# Patient Record
Sex: Female | Born: 1955 | Race: White | Hispanic: No | Smoking: Current every day smoker
Health system: Southern US, Community
[De-identification: ages and names within clinical notes are randomized; demographics above are authoritative.]

## PROBLEM LIST (undated history)

## (undated) DIAGNOSIS — J984 Other disorders of lung: Secondary | ICD-10-CM

## (undated) DIAGNOSIS — F419 Anxiety disorder, unspecified: Secondary | ICD-10-CM

## (undated) DIAGNOSIS — J189 Pneumonia, unspecified organism: Secondary | ICD-10-CM

## (undated) DIAGNOSIS — J449 Chronic obstructive pulmonary disease, unspecified: Secondary | ICD-10-CM

## (undated) DIAGNOSIS — F329 Major depressive disorder, single episode, unspecified: Secondary | ICD-10-CM

## (undated) DIAGNOSIS — I739 Peripheral vascular disease, unspecified: Secondary | ICD-10-CM

## (undated) DIAGNOSIS — F32A Depression, unspecified: Secondary | ICD-10-CM

## (undated) DIAGNOSIS — J410 Simple chronic bronchitis: Secondary | ICD-10-CM

## (undated) DIAGNOSIS — I639 Cerebral infarction, unspecified: Secondary | ICD-10-CM

## (undated) DIAGNOSIS — E785 Hyperlipidemia, unspecified: Secondary | ICD-10-CM

## (undated) HISTORY — PX: COLONOSCOPY: SHX174

## (undated) HISTORY — PX: TUBAL LIGATION: SHX77

## (undated) HISTORY — PX: AORTO-FEMORAL BYPASS GRAFT: SHX885

---

## 1998-09-16 ENCOUNTER — Other Ambulatory Visit: Admission: RE | Admit: 1998-09-16 | Discharge: 1998-09-16 | Payer: Self-pay | Admitting: *Deleted

## 1999-08-25 ENCOUNTER — Encounter: Payer: Self-pay | Admitting: Emergency Medicine

## 1999-08-25 ENCOUNTER — Inpatient Hospital Stay (HOSPITAL_COMMUNITY): Admission: EM | Admit: 1999-08-25 | Discharge: 1999-08-26 | Payer: Self-pay | Admitting: Emergency Medicine

## 1999-08-25 ENCOUNTER — Encounter: Payer: Self-pay | Admitting: Family Medicine

## 1999-08-26 ENCOUNTER — Encounter: Payer: Self-pay | Admitting: Family Medicine

## 1999-09-03 ENCOUNTER — Encounter: Admission: RE | Admit: 1999-09-03 | Discharge: 1999-09-03 | Payer: Self-pay | Admitting: Family Medicine

## 1999-11-05 ENCOUNTER — Other Ambulatory Visit: Admission: RE | Admit: 1999-11-05 | Discharge: 1999-11-05 | Payer: Self-pay | Admitting: *Deleted

## 2015-12-11 DIAGNOSIS — I639 Cerebral infarction, unspecified: Secondary | ICD-10-CM | POA: Diagnosis present

## 2015-12-11 HISTORY — DX: Cerebral infarction, unspecified: I63.9

## 2015-12-24 DIAGNOSIS — J189 Pneumonia, unspecified organism: Secondary | ICD-10-CM

## 2015-12-24 HISTORY — DX: Pneumonia, unspecified organism: J18.9

## 2015-12-27 ENCOUNTER — Other Ambulatory Visit (HOSPITAL_COMMUNITY): Payer: Self-pay | Admitting: Interventional Radiology

## 2015-12-27 DIAGNOSIS — I6350 Cerebral infarction due to unspecified occlusion or stenosis of unspecified cerebral artery: Secondary | ICD-10-CM

## 2015-12-27 DIAGNOSIS — Q2730 Arteriovenous malformation, site unspecified: Secondary | ICD-10-CM

## 2016-01-13 ENCOUNTER — Other Ambulatory Visit (HOSPITAL_COMMUNITY): Payer: Self-pay | Admitting: Interventional Radiology

## 2016-01-13 ENCOUNTER — Ambulatory Visit (HOSPITAL_COMMUNITY)
Admission: RE | Admit: 2016-01-13 | Discharge: 2016-01-13 | Disposition: A | Discharge status: Home / Self Care | Payer: BLUE CROSS/BLUE SHIELD | Source: Ambulatory Visit | Attending: Interventional Radiology | Admitting: Interventional Radiology

## 2016-01-13 DIAGNOSIS — I6350 Cerebral infarction due to unspecified occlusion or stenosis of unspecified cerebral artery: Secondary | ICD-10-CM

## 2016-01-13 DIAGNOSIS — I671 Cerebral aneurysm, nonruptured: Secondary | ICD-10-CM

## 2016-01-13 DIAGNOSIS — Q2730 Arteriovenous malformation, site unspecified: Secondary | ICD-10-CM

## 2016-01-13 DIAGNOSIS — I6320 Cerebral infarction due to unspecified occlusion or stenosis of unspecified precerebral arteries: Secondary | ICD-10-CM

## 2016-01-16 ENCOUNTER — Other Ambulatory Visit: Payer: Self-pay | Admitting: Radiology

## 2016-01-17 ENCOUNTER — Other Ambulatory Visit (HOSPITAL_COMMUNITY): Payer: Self-pay | Admitting: Interventional Radiology

## 2016-01-17 ENCOUNTER — Ambulatory Visit (HOSPITAL_COMMUNITY)
Admission: RE | Admit: 2016-01-17 | Discharge: 2016-01-17 | Disposition: A | Discharge status: Home / Self Care | Payer: BLUE CROSS/BLUE SHIELD | Source: Ambulatory Visit | Attending: Interventional Radiology | Admitting: Interventional Radiology

## 2016-01-17 DIAGNOSIS — I6320 Cerebral infarction due to unspecified occlusion or stenosis of unspecified precerebral arteries: Secondary | ICD-10-CM

## 2016-01-17 DIAGNOSIS — Z7902 Long term (current) use of antithrombotics/antiplatelets: Secondary | ICD-10-CM | POA: Insufficient documentation

## 2016-01-17 DIAGNOSIS — J439 Emphysema, unspecified: Secondary | ICD-10-CM | POA: Insufficient documentation

## 2016-01-17 DIAGNOSIS — Z880 Allergy status to penicillin: Secondary | ICD-10-CM | POA: Insufficient documentation

## 2016-01-17 DIAGNOSIS — F17210 Nicotine dependence, cigarettes, uncomplicated: Secondary | ICD-10-CM | POA: Insufficient documentation

## 2016-01-17 DIAGNOSIS — Z7982 Long term (current) use of aspirin: Secondary | ICD-10-CM | POA: Insufficient documentation

## 2016-01-17 DIAGNOSIS — I671 Cerebral aneurysm, nonruptured: Secondary | ICD-10-CM

## 2016-01-17 DIAGNOSIS — Z8673 Personal history of transient ischemic attack (TIA), and cerebral infarction without residual deficits: Secondary | ICD-10-CM | POA: Insufficient documentation

## 2016-01-17 DIAGNOSIS — G450 Vertebro-basilar artery syndrome: Secondary | ICD-10-CM | POA: Insufficient documentation

## 2016-01-17 LAB — CBC WITH DIFFERENTIAL/PLATELET
Basophils Absolute: 0.1 10*3/uL (ref 0.0–0.1)
Basophils Relative: 0 %
EOS ABS: 1.3 10*3/uL — AB (ref 0.0–0.7)
EOS PCT: 11 %
HCT: 47.5 % — ABNORMAL HIGH (ref 36.0–46.0)
Hemoglobin: 16.2 g/dL — ABNORMAL HIGH (ref 12.0–15.0)
LYMPHS ABS: 1.4 10*3/uL (ref 0.7–4.0)
LYMPHS PCT: 12 %
MCH: 34.1 pg — AB (ref 26.0–34.0)
MCHC: 34.1 g/dL (ref 30.0–36.0)
MCV: 100 fL (ref 78.0–100.0)
MONO ABS: 1.2 10*3/uL — AB (ref 0.1–1.0)
MONOS PCT: 10 %
NEUTROS ABS: 8.4 10*3/uL — AB (ref 1.7–7.7)
NEUTROS PCT: 67 %
PLATELETS: 267 10*3/uL (ref 150–400)
RBC: 4.75 MIL/uL (ref 3.87–5.11)
RDW: 12.9 % (ref 11.5–15.5)
WBC: 12.3 10*3/uL — ABNORMAL HIGH (ref 4.0–10.5)

## 2016-01-17 LAB — BASIC METABOLIC PANEL
ANION GAP: 16 — AB (ref 5–15)
BUN: 7 mg/dL (ref 6–20)
CALCIUM: 9.7 mg/dL (ref 8.9–10.3)
CO2: 25 mmol/L (ref 22–32)
Chloride: 95 mmol/L — ABNORMAL LOW (ref 101–111)
Creatinine, Ser: 0.61 mg/dL (ref 0.44–1.00)
Glucose, Bld: 83 mg/dL (ref 65–99)
POTASSIUM: 3.5 mmol/L (ref 3.5–5.1)
Sodium: 136 mmol/L (ref 135–145)

## 2016-01-17 LAB — PROTIME-INR
INR: 0.93 (ref 0.00–1.49)
PROTHROMBIN TIME: 12.7 s (ref 11.6–15.2)

## 2016-01-17 LAB — APTT: APTT: 29 s (ref 24–37)

## 2016-01-17 MED ORDER — VANCOMYCIN HCL IN DEXTROSE 1-5 GM/200ML-% IV SOLN
1000.0000 mg | Freq: Once | INTRAVENOUS | Status: AC
  Administered 2016-01-17: 1000 mg via INTRAVENOUS
  Filled 2016-01-17: qty 200

## 2016-01-17 MED ORDER — SODIUM CHLORIDE 0.9 % IV SOLN
INTRAVENOUS | Status: AC | PRN
  Administered 2016-01-17: 10 mL/h via INTRAVENOUS

## 2016-01-17 MED ORDER — SODIUM CHLORIDE 0.9 % IV SOLN: INTRAVENOUS | Status: DC

## 2016-01-17 MED ORDER — FENTANYL CITRATE (PF) 100 MCG/2ML IJ SOLN
INTRAMUSCULAR | Status: AC
  Filled 2016-01-17: qty 2

## 2016-01-17 MED ORDER — IOHEXOL 300 MG/ML  SOLN
250.0000 mL | Freq: Once | INTRAMUSCULAR | Status: AC | PRN
  Administered 2016-01-17: 100 mL via INTRAVENOUS

## 2016-01-17 MED ORDER — HEPARIN SODIUM (PORCINE) 1000 UNIT/ML IJ SOLN
INTRAMUSCULAR | Status: AC
  Filled 2016-01-17: qty 1

## 2016-01-17 MED ORDER — ASPIRIN 325 MG PO TABS
ORAL_TABLET | ORAL | Status: AC
  Filled 2016-01-17: qty 1

## 2016-01-17 MED ORDER — SODIUM CHLORIDE 0.9 % IV SOLN: INTRAVENOUS | Status: AC

## 2016-01-17 MED ORDER — MIDAZOLAM HCL 2 MG/2ML IJ SOLN
INTRAMUSCULAR | Status: AC | PRN
  Administered 2016-01-17 (×2): 1 mg via INTRAVENOUS

## 2016-01-17 MED ORDER — LIDOCAINE HCL 1 % IJ SOLN
INTRAMUSCULAR | Status: AC
  Filled 2016-01-17: qty 20

## 2016-01-17 MED ORDER — HEPARIN SODIUM (PORCINE) 1000 UNIT/ML IJ SOLN
INTRAMUSCULAR | Status: AC | PRN
  Administered 2016-01-17: 500 [IU] via INTRAVENOUS
  Administered 2016-01-17: 1000 [IU] via INTRAVENOUS

## 2016-01-17 MED ORDER — MIDAZOLAM HCL 2 MG/2ML IJ SOLN
INTRAMUSCULAR | Status: AC
  Filled 2016-01-17: qty 2

## 2016-01-17 MED ORDER — FENTANYL CITRATE (PF) 100 MCG/2ML IJ SOLN
INTRAMUSCULAR | Status: AC | PRN
  Administered 2016-01-17 (×2): 25 ug via INTRAVENOUS

## 2016-01-17 NOTE — Progress Notes (Signed)
Patient ambulated for 10 minutes.  Site on right groin at level zero.  Dressing changed.  Discharge instructions discussed with patients daughter at bedside.

## 2016-01-17 NOTE — Discharge Instructions (Signed)
Angiogram, Care After °Refer to this sheet in the next few weeks. These instructions provide you with information about caring for yourself after your procedure. Your health care provider may also give you more specific instructions. Your treatment has been planned according to current medical practices, but problems sometimes occur. Call your health care provider if you have any problems or questions after your procedure. °WHAT TO EXPECT AFTER THE PROCEDURE °After your procedure, it is typical to have the following: °· Bruising at the catheter insertion site that usually fades within 1-2 weeks. °· Blood collecting in the tissue (hematoma) that may be painful to the touch. It should usually decrease in size and tenderness within 1-2 weeks. °HOME CARE INSTRUCTIONS °· Take medicines only as directed by your health care provider. °· You may shower 24-48 hours after the procedure or as directed by your health care provider. Remove the bandage (dressing) and gently wash the site with plain soap and water. Pat the area dry with a clean towel. Do not rub the site, because this may cause bleeding. °· Do not take baths, swim, or use a hot tub until your health care provider approves. °· Check your insertion site every day for redness, swelling, or drainage. °· Do not apply powder or lotion to the site. °· Do not lift over 10 lb (4.5 kg) for 5 days after your procedure or as directed by your health care provider. °· Ask your health care provider when it is okay to: °¨ Return to work or school. °¨ Resume usual physical activities or sports. °¨ Resume sexual activity. °· Do not drive home if you are discharged the same day as the procedure. Have someone else drive you. °· You may drive 24 hours after the procedure unless otherwise instructed by your health care provider. °· Do not operate machinery or power tools for 24 hours after the procedure or as directed by your health care provider. °· If your procedure was done as an  outpatient procedure, which means that you went home the same day as your procedure, a responsible adult should be with you for the first 24 hours after you arrive home. °· Keep all follow-up visits as directed by your health care provider. This is important. °SEEK MEDICAL CARE IF: °· You have a fever. °· You have chills. °· You have increased bleeding from the catheter insertion site. Hold pressure on the site. °SEEK IMMEDIATE MEDICAL CARE IF: °· You have unusual pain at the catheter insertion site. °· You have redness, warmth, or swelling at the catheter insertion site. °· You have drainage (other than a small amount of blood on the dressing) from the catheter insertion site. °· The catheter insertion site is bleeding, and the bleeding does not stop after 30 minutes of holding steady pressure on the site. °· The area near or just beyond the catheter insertion site becomes pale, cool, tingly, or numb. °  °This information is not intended to replace advice given to you by your health care provider. Make sure you discuss any questions you have with your health care provider. °  °Document Released: 05/14/2005 Document Revised: 11/16/2014 Document Reviewed: 03/29/2013 °Elsevier Interactive Patient Education ©2016 [employer] ° °

## 2016-01-17 NOTE — Sedation Documentation (Signed)
02 and ETC02 removed per Dr. Estanislado Pandy.

## 2016-01-17 NOTE — H&P (Signed)
Chief Complaint: 70m periopthalmic aneurysm  Referring Physician(s): IHollice Gong Mir  Supervising Physician: DEstanislado Pandy History of Present Illness: Kylie VOYTKOis a 60y.o. 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  She apparently was admitted with symptoms of severe onset of headache with syncope on 12/24/2015.  At the time of her admission, she was also noted to suffer from community acquired pneumonia  And she has completed antibiotic therapy and she feels it has resolved.  She was appropriately treated for that.  During the workup which involved a CT of the brain and a CT angiogram of the brain were performed. These apparently revealed the presence of abnormal vasculature of the foramen magnum region associated with acute bilateral cerebellar strokes.  It was felt there may be abnormal vascular emanating from the left vertebral artery.  She then underwent PT/OT and has almost completely return to a normal neurological activity.  She denies any symptoms of diplopia, visual blurriness, balance difficulties or difficulty with swallowing or fine motor movement. Denies any bilateral tinnitus or hearing loss.  The CT angiogram revealed the presence of a 4 mm left periophthalmic region aneurysm.  She is here today for diagnostic cerebral angiography.  She feels well today.  She denies fever/chills.  She is NPO.  She takes Plavix and Aspirin.  Past Medical History: Stroke Emphysema  Past Surgical History: Aortofemoral bypass by Dr. LKellie Simmeringin 2000  Allergies: Penicillins  Medications: Prior to Admission medications   Medication Sig Start Date End Date Taking? Authorizing Provider  aspirin EC 81 MG tablet Take 81 mg by mouth daily.   Yes Historical Provider, MD  atorvastatin (LIPITOR) 10 MG tablet Take 10 mg by mouth at bedtime.  12/15/15  Yes Historical Provider, MD  citalopram (CELEXA) 20 MG tablet Take 20 mg by mouth every evening. 12/15/15  Yes Historical Provider, MD  citalopram (CELEXA) 40 MG tablet Take 40 mg by mouth every evening. 12/15/15  Yes Historical Provider, MD  clopidogrel (PLAVIX) 75 MG tablet Take 75 mg by mouth daily.   Yes Historical Provider, MD  Vitamin D, Ergocalciferol, (DRISDOL) 50000 units CAPS capsule Take 50,000 Units by mouth every 14 (fourteen) days. 12/15/15  Yes Historical Provider, MD     Family History: Patient is adopted  Social History   Social History  . Marital Status: Divorced    Spouse Name: N/A  . Number of Children: N/A  . Years of Education: N/A   Social History Main Topics  . Smoking status: Not on file  . Smokeless tobacco: Not on file  . Alcohol Use: Not on file  . Drug Use: Not on file  . Sexual Activity: Not on file   Other Topics Concern  . Not on file   Social History Narrative  . No narrative on file     Review of Systems: A 12 point ROS discussed  Review of Systems  Constitutional: Negative for fever, chills, activity change, appetite change and fatigue.  HENT: Negative.   Respiratory: Negative for cough, chest tightness, shortness of breath and wheezing.   Cardiovascular: Negative for chest pain.  Gastrointestinal: Negative for nausea, vomiting and abdominal pain.  Genitourinary: Negative.   Musculoskeletal: Negative.   Skin: Negative.   Neurological: Negative.   Psychiatric/Behavioral: Negative.     Vital Signs: BP 111/62 mmHg  Temp(Src) 98 F (36.7 C) (Oral)  Resp 16  Ht '5\' 4"'$  (1.626  m)  Wt 110 lb (49.896 kg)  BMI 18.87 kg/m2  SpO2 96%  Physical Exam  Constitutional: She is oriented to person, place, and time. She appears well-developed and well-nourished.  HENT:  Head: Atraumatic.  Eyes: EOM are normal.  Neck: Normal range of motion. Neck supple.  Cardiovascular: Normal rate, regular rhythm and normal heart sounds.     Pulmonary/Chest: Effort normal and breath sounds normal.  Abdominal: Soft. Bowel sounds are normal. She exhibits no distension. There is no tenderness.  Musculoskeletal: Normal range of motion.  Neurological: She is alert and oriented to person, place, and time.  Skin: Skin is warm and dry.  Psychiatric: She has a normal mood and affect. Her behavior is normal. Judgment and thought content normal.  Vitals reviewed.   Mallampati Score:  MD Evaluation Airway: WNL Heart: WNL Abdomen: WNL Chest/ Lungs: WNL ASA  Classification: 2 Mallampati/Airway Score: One  Imaging: Ir Radiologist Eval & Mgmt  01/14/2016  EXAM: NEW PATIENT OFFICE VISIT CHIEF COMPLAINT: Episode of syncope and headaches. 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 The patient is accompanied by her daughter. History and physical from both the patient and also from notes from [facility]. She apparently was admitted with symptoms of severe onset of headache with syncope on 12/24/2015. At the time of her admission, she was also noted to suffer from community acquired pneumonia. She was appropriately treated for that. During the workup which involved a CT of the brain and a CT angiogram of the brain were performed. These apparently revealed the presence of abnormal vasculature of the foramen magnum region associated with acute bilateral cerebellar strokes. It was felt there may be abnormal vascular emanating from the left vertebral artery. She then underwent PT/OT and has almost completely return to a normal neurological activity. She denies any symptoms of diplopia, visual blurriness, balance difficulties or difficulty with swallowing or fine motor movement. Denies any bilateral tinnitus or hearing loss. The CT angiogram apparently also  revealed the presence of a 4 mm left periophthalmic region aneurysm. The chest x-ray was consistent with pulmonary emphysema. 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 She denies any autonomic dysfunction of her bowel or bladder activity. She denies any paresthesias or weakness of the upper or lower extremities. She denies any severe shooting, electric-like pain down into her lower lumbosacral spine and in the lower extremities. Her speech has been normal. As mentioned above, her coordination is almost back to normal. During her stay at [facility], she was seen by a neurologist also. Her weight is steady, appetite unchanged. Denies any chest pains or shortness of breath or palpitations. Has a smoker's early morning cough without any hemoptysis though occasionally with phlegm. No abdominal pain, nausea, vomiting or constipation. Denies any UTI symptoms of dysuria, hematuria or polyuria. Past Medical History: Stroke as mentioned above. Pulmonary emphysema. Past Surgical history of aortofemoral bypass in 2000 by Dr. Kellie Simmering at [facility]. At the present time, she denies any symptoms of intermittent claudication in the lower extremities with activity or climbing stairs. Medications: Aspirin 81 mg a day. Plavix 75 mg a day. Atorvastatin. Citalopram. Vitamin-D. Allergies: Penicillin which causes bronchospasm. Social History: The patient is divorced, has two daughters alive and well. Patient  is adopted. She smokes up to a pack a day has done so for many years. She drinks 3 beers a day. Denies any illicit chemicals. Family History: Patient is adopted and does not have knowledge of any past family medical history. REVIEW OF SYSTEMS: Essentially negative except for as mentioned above. PHYSICAL EXAMINATION: In no acute distress. Affect normal. Neurologically, no gross  abnormalities detected. ASSESSMENT AND PLAN: The patient's recent CT of the brain and CT angiogram of the brain were reviewed. These demonstrate the presence of approximately 4 mm left periophthalmic region aneurysm. Additionally noted are abnormally prominent epidural vessels extending the upper cervical spine from the foramen magnum with a slightly more prominent enhancement along the dorsal aspect at the level of C4-5. Grossly there appears to be no change in the caliber of the cervical cord on the limited CT angiogram. The CT of the brain reveals focal areas of hypoattenuation involving the right cerebral hemisphere subcortically at the level of the frontal lobe, and also the cerebral hemispheres, and less distinctly, the left cerebral hemisphere subcortically. The findings were reviewed with the patient and patient's daughter. It was felt further more accurate evaluation of the vascular abnormalities would require a formal catheter angiogram. The procedure, the risks, benefits and the alternatives were all reviewed. The patient and her daughter are in agreement to proceed with a diagnostic cerebral arteriogram which will be scheduled at the earliest possible. In the meantime, the patient was strongly advised to stop smoking and to continue her present medications. Also both common femoral pulses were palpable clinically per our nursing staff. Further recommendations regarding management to depend on the findings of the formal catheter carotid and vertebral arteriograms. Both the patient and her daughter were asked to call should they have any concerns or questions. Electronically Signed   By: Luanne Bras M.D.   On: 01/13/2016 15:26    Labs:  CBC:  Recent Labs  01/17/16 0655  WBC 12.3*  HGB 16.2*  HCT 47.5*  PLT 267    COAGS:  Recent Labs  01/17/16 0655  INR 0.93  APTT 29    BMP: No results for input(s): NA, K, CL, CO2, GLUCOSE, BUN, CALCIUM, CREATININE, GFRNONAA, GFRAA in the  last 8760 hours.  Invalid input(s): CMP  LIVER FUNCTION TESTS: No results for input(s): BILITOT, AST, ALT, ALKPHOS, PROT, ALBUMIN in the last 8760 hours.  TUMOR MARKERS: No results for input(s): AFPTM, CEA, CA199, CHROMGRNA in the last 8760 hours.  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  Will proceed with diagnostic angiography today by Dr. DEstanislado Pandy  Risks and Benefits discussed with the patient including, but not limited to bleeding, infection, vascular injury, contrast induced renal failure, stroke or even death.  All of the patient's questions were answered, patient is agreeable to proceed. Consent signed and in chart.  Thank you for this interesting consult.  I greatly enjoyed meeting Kylie MORANDIand look forward to participating in their care.  A copy of this report was sent to the requesting provider on this date.  Electronically Signed: WMurrell ReddenPA-C 01/17/2016, 8:02 AM   I spent a total of  15 Minutes in face to face in clinical consultation, greater than 50% of which was counseling/coordinating care for cerebral angiography.

## 2016-01-17 NOTE — Procedures (Signed)
SA/P 4 vessel cerebral arteriogram. RT CFA approach. Findings. 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 3.mod flow post cervical  Intraspinal DAVF  With graiinage into a midline basilar vein.Single arterial feeder fom LT VA at C5-C6

## 2016-01-21 ENCOUNTER — Other Ambulatory Visit (HOSPITAL_COMMUNITY): Payer: Self-pay | Admitting: Interventional Radiology

## 2016-01-21 ENCOUNTER — Telehealth (HOSPITAL_COMMUNITY): Payer: Self-pay | Admitting: Interventional Radiology

## 2016-01-21 DIAGNOSIS — I6320 Cerebral infarction due to unspecified occlusion or stenosis of unspecified precerebral arteries: Secondary | ICD-10-CM

## 2016-01-21 DIAGNOSIS — I671 Cerebral aneurysm, nonruptured: Secondary | ICD-10-CM

## 2016-01-21 DIAGNOSIS — M542 Cervicalgia: Secondary | ICD-10-CM

## 2016-01-21 NOTE — Telephone Encounter (Signed)
Called pt, left VM for her to call me back about scheduling her MRI. JM

## 2016-02-12 ENCOUNTER — Other Ambulatory Visit: Payer: Self-pay | Admitting: Radiology

## 2016-02-13 NOTE — Pre-Procedure Instructions (Signed)
    MATTEA SEGER  02/13/2016      [id number] [place], [place] - [address] [place] [address]Phone: [phone]: [phone]   Your procedure is scheduled on Tues, April 11 @ 10:00 AM  [facility]  Call this number if you have problems the morning of surgery:  [phone]   Remember:  Do not eat food or drink liquids after midnight.  Take these medicines the morning of surgery with A SIP OF WATER Combivent<Bring Your Inhaler With You>              No Goody's,BC's,Aleve,Advil,Motrin,Ibuprofen,Fish Oil,or any Herbal Medications.    Do not wear jewelry, make-up or nail polish.  Do not wear lotions, powders, or perfumes.  You may wear deodorant.  Do not shave 48 hours prior to surgery.    Do not bring valuables to the hospital.  [facility] any belongings or [facility].  Contacts, dentures or bridgework may not be worn into surgery.  Leave your suitcase in the car.  After surgery it may be brought to your room.  For patients admitted to the hospital, discharge time will be determined by your treatment team.  Patients discharged the day of surgery will not be allowed to drive home.      Please read over the following fact sheets that you were given. Coughing and Deep Breathing

## 2016-02-14 ENCOUNTER — Encounter (HOSPITAL_COMMUNITY): Payer: Self-pay

## 2016-02-14 ENCOUNTER — Encounter (HOSPITAL_COMMUNITY)
Admission: RE | Admit: 2016-02-14 | Discharge: 2016-02-14 | Disposition: A | Discharge status: Home / Self Care | Payer: BLUE CROSS/BLUE SHIELD | Source: Ambulatory Visit | Attending: Interventional Radiology | Admitting: Interventional Radiology

## 2016-02-14 DIAGNOSIS — E785 Hyperlipidemia, unspecified: Secondary | ICD-10-CM | POA: Diagnosis not present

## 2016-02-14 DIAGNOSIS — I739 Peripheral vascular disease, unspecified: Secondary | ICD-10-CM | POA: Diagnosis not present

## 2016-02-14 DIAGNOSIS — F17200 Nicotine dependence, unspecified, uncomplicated: Secondary | ICD-10-CM | POA: Insufficient documentation

## 2016-02-14 DIAGNOSIS — Z7902 Long term (current) use of antithrombotics/antiplatelets: Secondary | ICD-10-CM | POA: Diagnosis not present

## 2016-02-14 DIAGNOSIS — Z7982 Long term (current) use of aspirin: Secondary | ICD-10-CM | POA: Diagnosis not present

## 2016-02-14 DIAGNOSIS — Z01812 Encounter for preprocedural laboratory examination: Secondary | ICD-10-CM | POA: Diagnosis not present

## 2016-02-14 DIAGNOSIS — J449 Chronic obstructive pulmonary disease, unspecified: Secondary | ICD-10-CM | POA: Insufficient documentation

## 2016-02-14 DIAGNOSIS — Z79899 Other long term (current) drug therapy: Secondary | ICD-10-CM | POA: Insufficient documentation

## 2016-02-14 DIAGNOSIS — Z01818 Encounter for other preprocedural examination: Secondary | ICD-10-CM | POA: Diagnosis not present

## 2016-02-14 DIAGNOSIS — Z8673 Personal history of transient ischemic attack (TIA), and cerebral infarction without residual deficits: Secondary | ICD-10-CM | POA: Diagnosis not present

## 2016-02-14 HISTORY — DX: Pneumonia, unspecified organism: J18.9

## 2016-02-14 HISTORY — DX: Chronic obstructive pulmonary disease, unspecified: J44.9

## 2016-02-14 HISTORY — DX: Peripheral vascular disease, unspecified: I73.9

## 2016-02-14 HISTORY — DX: Hyperlipidemia, unspecified: E78.5

## 2016-02-14 HISTORY — DX: Major depressive disorder, single episode, unspecified: F32.9

## 2016-02-14 HISTORY — DX: Cerebral infarction, unspecified: I63.9

## 2016-02-14 HISTORY — DX: Simple chronic bronchitis: J41.0

## 2016-02-14 HISTORY — DX: Depression, unspecified: F32.A

## 2016-02-14 LAB — BASIC METABOLIC PANEL
Anion gap: 10 (ref 5–15)
BUN: 6 mg/dL (ref 6–20)
CALCIUM: 9.1 mg/dL (ref 8.9–10.3)
CO2: 26 mmol/L (ref 22–32)
CREATININE: 0.6 mg/dL (ref 0.44–1.00)
Chloride: 93 mmol/L — ABNORMAL LOW (ref 101–111)
Glucose, Bld: 101 mg/dL — ABNORMAL HIGH (ref 65–99)
Potassium: 4.7 mmol/L (ref 3.5–5.1)
SODIUM: 129 mmol/L — AB (ref 135–145)

## 2016-02-14 LAB — CBC
HCT: 42.6 % (ref 36.0–46.0)
Hemoglobin: 15 g/dL (ref 12.0–15.0)
MCH: 34.8 pg — AB (ref 26.0–34.0)
MCHC: 35.2 g/dL (ref 30.0–36.0)
MCV: 98.8 fL (ref 78.0–100.0)
PLATELETS: 271 10*3/uL (ref 150–400)
RBC: 4.31 MIL/uL (ref 3.87–5.11)
RDW: 12.7 % (ref 11.5–15.5)
WBC: 12.2 10*3/uL — ABNORMAL HIGH (ref 4.0–10.5)

## 2016-02-14 NOTE — Progress Notes (Signed)
Anesthesia Chart Review:  Pt is a 60 year old female scheduled for MRI of brain and cervical spine on 02/18/2016.   PMH includes:  Stroke 12/2015, PVD, COPD, hyperlipidemia. Current smoker. BMI 18. S/p aorto-femoral bypass graft  Medications include: ASA, lipitor, plavix  Preoperative labs reviewed.  NA 129, Cl 93. Cl consistent with previous results. Will recheck BMET day of procedure.   Chest x-ray report 12/24/15 reviewed. Persistent consolidation/pneumonia in LLL posterior medially. No pulmonary edema. Mild degenerative changes thoracic spine  EKG 12/24/15: NSR  Echo 12/24/15:  1. LV systolic function hyperdynamic with EF >70% 2. LA normal in size 3. Trace to very mild TR 4. RV systolic pressure is normal at <35 mmHg  There is no H&P within 30 days. Left voicemail for [facility] in Dr. Arlean Hopping office (ordering MD) about need for updated H&P prior to MRI.   If labs acceptable and H&P on file, I anticipate pt can proceed as scheduled.   Willeen Cass, FNP-BC [facility]/Anesthesiology Phone: [phone] 02/14/2016 12:46 PM

## 2016-02-14 NOTE — Progress Notes (Signed)
Cardiologist denies  Medical Md is Dr.Brad Marcello [place] and sees PA Jonni Sanger thinks one was done in Feb at [facility] request   Stress test in epic from 2000  Heart cath denies  EKG thinks one was done in Feb at [facility] request  CXR thinks one was done in Feb at [facility] request

## 2016-02-18 ENCOUNTER — Ambulatory Visit (HOSPITAL_COMMUNITY)
Admission: RE | Admit: 2016-02-18 | Discharge: 2016-02-18 | Disposition: A | Discharge status: Home / Self Care | Payer: BLUE CROSS/BLUE SHIELD | Source: Ambulatory Visit | Attending: Interventional Radiology | Admitting: Interventional Radiology

## 2016-02-18 ENCOUNTER — Ambulatory Visit (HOSPITAL_COMMUNITY): Payer: BLUE CROSS/BLUE SHIELD | Admitting: Emergency Medicine

## 2016-02-18 ENCOUNTER — Ambulatory Visit (HOSPITAL_COMMUNITY): Payer: BLUE CROSS/BLUE SHIELD

## 2016-02-18 ENCOUNTER — Encounter (HOSPITAL_COMMUNITY): Payer: Self-pay | Admitting: Surgery

## 2016-02-18 ENCOUNTER — Encounter (HOSPITAL_COMMUNITY)
Admission: RE | Disposition: A | Discharge status: Home / Self Care | Payer: Self-pay | Source: Ambulatory Visit | Attending: Interventional Radiology

## 2016-02-18 ENCOUNTER — Ambulatory Visit (HOSPITAL_COMMUNITY): Admission: RE | Admit: 2016-02-18 | Payer: BLUE CROSS/BLUE SHIELD | Source: Ambulatory Visit

## 2016-02-18 ENCOUNTER — Ambulatory Visit (HOSPITAL_COMMUNITY): Payer: BLUE CROSS/BLUE SHIELD | Admitting: Anesthesiology

## 2016-02-18 DIAGNOSIS — L988 Other specified disorders of the skin and subcutaneous tissue: Secondary | ICD-10-CM | POA: Diagnosis not present

## 2016-02-18 DIAGNOSIS — M4802 Spinal stenosis, cervical region: Secondary | ICD-10-CM | POA: Insufficient documentation

## 2016-02-18 DIAGNOSIS — F17200 Nicotine dependence, unspecified, uncomplicated: Secondary | ICD-10-CM | POA: Diagnosis not present

## 2016-02-18 DIAGNOSIS — I6320 Cerebral infarction due to unspecified occlusion or stenosis of unspecified precerebral arteries: Secondary | ICD-10-CM

## 2016-02-18 DIAGNOSIS — I671 Cerebral aneurysm, nonruptured: Secondary | ICD-10-CM

## 2016-02-18 DIAGNOSIS — J449 Chronic obstructive pulmonary disease, unspecified: Secondary | ICD-10-CM | POA: Insufficient documentation

## 2016-02-18 DIAGNOSIS — J189 Pneumonia, unspecified organism: Secondary | ICD-10-CM | POA: Diagnosis not present

## 2016-02-18 DIAGNOSIS — M542 Cervicalgia: Secondary | ICD-10-CM

## 2016-02-18 HISTORY — PX: RADIOLOGY WITH ANESTHESIA: SHX6223

## 2016-02-18 LAB — BASIC METABOLIC PANEL
ANION GAP: 11 (ref 5–15)
BUN: 7 mg/dL (ref 6–20)
CALCIUM: 9.1 mg/dL (ref 8.9–10.3)
CO2: 23 mmol/L (ref 22–32)
Chloride: 98 mmol/L — ABNORMAL LOW (ref 101–111)
Creatinine, Ser: 0.51 mg/dL (ref 0.44–1.00)
GFR calc Af Amer: >60 mL/min (ref >60)
GLUCOSE: 98 mg/dL (ref 65–99)
Potassium: 4.2 mmol/L (ref 3.5–5.1)
Sodium: 132 mmol/L — ABNORMAL LOW (ref 135–145)

## 2016-02-18 SURGERY — RADIOLOGY WITH ANESTHESIA
Anesthesia: General

## 2016-02-18 MED ORDER — FENTANYL CITRATE (PF) 100 MCG/2ML IJ SOLN: 25.0000 ug | INTRAMUSCULAR | Status: DC | PRN

## 2016-02-18 MED ORDER — METOCLOPRAMIDE HCL 5 MG/ML IJ SOLN: 10.0000 mg | Freq: Once | INTRAMUSCULAR | Status: DC | PRN

## 2016-02-18 MED ORDER — GADOBENATE DIMEGLUMINE 529 MG/ML IV SOLN
10.0000 mL | Freq: Once | INTRAVENOUS | Status: AC
  Administered 2016-02-18: 10 mL via INTRAVENOUS

## 2016-02-18 MED ORDER — MEPERIDINE HCL 25 MG/ML IJ SOLN: 6.2500 mg | INTRAMUSCULAR | Status: DC | PRN

## 2016-02-18 MED ORDER — LACTATED RINGERS IV SOLN: INTRAVENOUS | Status: DC

## 2016-02-18 MED ORDER — LACTATED RINGERS IV SOLN
INTRAVENOUS | Status: DC
  Administered 2016-02-18: 09:00:00 via INTRAVENOUS

## 2016-02-18 NOTE — Transfer of Care (Signed)
Immediate Anesthesia Transfer of Care Note  Patient: Kylie Valencia  Procedure(s) Performed: Procedure(s): MRI OF BRAIN WITH AND WITHOUT CONTRAST, MRI OF CERVICAL SPINE (N/A)  Patient Location: PACU  Anesthesia Type:General  Level of Consciousness: awake, alert , oriented and sedated  Airway & Oxygen Therapy: Patient Spontanous Breathing  Post-op Assessment: Report given to RN, Post -op Vital signs reviewed and stable and Patient moving all extremities  Post vital signs: Reviewed and stable  Last Vitals:  Filed Vitals:   02/18/16 1258 02/18/16 1300  BP: 125/68   Pulse: 85 83  Temp: 36.8 C   Resp: 9 13    Complications: No apparent anesthesia complications

## 2016-02-18 NOTE — Anesthesia Postprocedure Evaluation (Signed)
Anesthesia Post Note  Patient: SHAANA ACOCELLA  Procedure(s) Performed: Procedure(s) (LRB): MRI OF BRAIN WITH AND WITHOUT CONTRAST, MRI OF CERVICAL SPINE (N/A)  Patient location during evaluation: PACU Anesthesia Type: General Level of consciousness: awake and alert Pain management: pain level controlled Vital Signs Assessment: post-procedure vital signs reviewed and stable Respiratory status: spontaneous breathing, nonlabored ventilation, respiratory function stable and patient connected to nasal cannula oxygen Cardiovascular status: blood pressure returned to baseline and stable Postop Assessment: no signs of nausea or vomiting Anesthetic complications: no    Last Vitals:  Filed Vitals:   02/18/16 1310 02/18/16 1315  BP:  115/65  Pulse: 82   Temp:    Resp: 13     Last Pain: There were no vitals filed for this visit.               Montez Hageman

## 2016-02-18 NOTE — Anesthesia Preprocedure Evaluation (Addendum)
Anesthesia Evaluation  Patient identified by MRN, date of birth, ID band Patient awake    Reviewed: Allergy & Precautions, NPO status , Patient's Chart, lab work & pertinent test results  Airway Mallampati: II  TM Distance: >3 FB Neck ROM: Full    Dental no notable dental hx. (+) Edentulous Upper, Edentulous Lower, Lower Dentures, Upper Dentures   Pulmonary COPD, Current Smoker,    Pulmonary exam normal breath sounds clear to auscultation       Cardiovascular negative cardio ROS Normal cardiovascular exam Rhythm:Regular Rate:Normal     Neuro/Psych Aneurysm  CVA, No Residual Symptoms negative neurological ROS  negative psych ROS   GI/Hepatic negative GI ROS, Neg liver ROS,   Endo/Other  negative endocrine ROS  Renal/GU negative Renal ROS  negative genitourinary   Musculoskeletal negative musculoskeletal ROS (+)   Abdominal   Peds negative pediatric ROS (+)  Hematology negative hematology ROS (+)   Anesthesia Other Findings   Reproductive/Obstetrics negative OB ROS                            Anesthesia Physical Anesthesia Plan  ASA: III  Anesthesia Plan: General   Post-op Pain Management:    Induction: Intravenous  Airway Management Planned: LMA and Oral ETT  Additional Equipment:   Intra-op Plan:   Post-operative Plan: Extubation in OR  Informed Consent: I have reviewed the patients History and Physical, chart, labs and discussed the procedure including the risks, benefits and alternatives for the proposed anesthesia with the patient or authorized representative who has indicated his/her understanding and acceptance.   Dental advisory given  Plan Discussed with: CRNA  Anesthesia Plan Comments:         Anesthesia Quick Evaluation

## 2016-02-19 ENCOUNTER — Encounter (HOSPITAL_COMMUNITY): Payer: Self-pay | Admitting: Interventional Radiology

## 2016-02-20 ENCOUNTER — Other Ambulatory Visit (HOSPITAL_COMMUNITY): Payer: Self-pay | Admitting: Interventional Radiology

## 2016-02-20 DIAGNOSIS — Q288 Other specified congenital malformations of circulatory system: Secondary | ICD-10-CM

## 2016-02-20 MED FILL — Lactated Ringer's Solution: INTRAVENOUS | Qty: 1000 | Status: AC

## 2016-02-20 MED FILL — Sodium Chloride IV Soln 0.9%: INTRAVENOUS | Qty: 250 | Status: AC

## 2016-02-20 MED FILL — Ephedrine Sulf-NaCl Soln Pref Syr 50 MG/10ML-0.9% (5 MG/ML): INTRAVENOUS | Qty: 10 | Status: AC

## 2016-02-20 MED FILL — Propofol IV Emul 500 MG/50ML (10 MG/ML): INTRAVENOUS | Qty: 50 | Status: AC

## 2016-02-20 MED FILL — Lidocaine HCl IV Inj 20 MG/ML: INTRAVENOUS | Qty: 5 | Status: AC

## 2016-02-20 MED FILL — Midazolam HCl Inj 2 MG/2ML (Base Equivalent): INTRAMUSCULAR | Qty: 2 | Status: AC

## 2016-02-25 ENCOUNTER — Ambulatory Visit (HOSPITAL_COMMUNITY)
Admission: RE | Admit: 2016-02-25 | Discharge: 2016-02-25 | Disposition: A | Discharge status: Home / Self Care | Payer: BLUE CROSS/BLUE SHIELD | Source: Ambulatory Visit | Attending: Interventional Radiology | Admitting: Interventional Radiology

## 2016-02-25 DIAGNOSIS — Q288 Other specified congenital malformations of circulatory system: Secondary | ICD-10-CM

## 2016-03-04 ENCOUNTER — Other Ambulatory Visit (HOSPITAL_COMMUNITY): Payer: Self-pay | Admitting: Interventional Radiology

## 2016-03-04 DIAGNOSIS — I671 Cerebral aneurysm, nonruptured: Secondary | ICD-10-CM

## 2016-03-11 ENCOUNTER — Other Ambulatory Visit: Payer: Self-pay | Admitting: Radiology

## 2016-03-13 ENCOUNTER — Other Ambulatory Visit: Payer: Self-pay | Admitting: Radiology

## 2016-03-13 ENCOUNTER — Telehealth (HOSPITAL_COMMUNITY): Payer: Self-pay | Admitting: Interventional Radiology

## 2016-03-13 ENCOUNTER — Other Ambulatory Visit (HOSPITAL_COMMUNITY)
Admission: RE | Admit: 2016-03-13 | Discharge: 2016-03-13 | Disposition: A | Discharge status: Home / Self Care | Payer: BLUE CROSS/BLUE SHIELD | Source: Ambulatory Visit | Attending: Interventional Radiology | Admitting: Interventional Radiology

## 2016-03-13 ENCOUNTER — Encounter (HOSPITAL_COMMUNITY): Payer: Self-pay | Admitting: *Deleted

## 2016-03-13 DIAGNOSIS — Z029 Encounter for administrative examinations, unspecified: Secondary | ICD-10-CM | POA: Insufficient documentation

## 2016-03-13 LAB — PLATELET INHIBITION P2Y12: PLATELET FUNCTION P2Y12: 9 [PRU] — AB (ref 194–418)

## 2016-03-13 NOTE — Telephone Encounter (Signed)
Dev reviewed pt's P2Y12 results from today. He instructed pt to take Aspirin '81mg'$  and Plavix 37.'5mg'$  1 daily. Pt states agreement. JM

## 2016-03-15 NOTE — Anesthesia Preprocedure Evaluation (Addendum)
Anesthesia Evaluation  Patient identified by MRN, date of birth, ID band Patient awake    Reviewed: Allergy & Precautions, NPO status , Patient's Chart, lab work & pertinent test results  Airway Mallampati: II  TM Distance: >3 FB Neck ROM: Full    Dental  (+) Dental Advisory Given, Edentulous Lower, Edentulous Upper   Pulmonary COPD,  COPD inhaler, Current Smoker,    Pulmonary exam normal breath sounds clear to auscultation       Cardiovascular + Peripheral Vascular Disease  Normal cardiovascular exam Rhythm:Regular Rate:Normal     Neuro/Psych PSYCHIATRIC DISORDERS Depression 01/17/16 Angiogram: IMPRESSION: 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  Approximately 3.5 x 3 mm aneurysm arising at the petrous cavernous junction of the right internal carotid artery associated with a dysplastic internal carotid artery at the site of the aneurysm.  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 Single feeder arising from the left vertebral artery at the level of C6 supplies this intraspinal extramedullary dural AV fistula. CVA    GI/Hepatic negative GI ROS, Neg liver ROS,   Endo/Other  negative endocrine ROS  Renal/GU negative Renal ROS     Musculoskeletal negative musculoskeletal ROS (+)   Abdominal   Peds  Hematology negative hematology ROS (+)   Anesthesia Other Findings Day of surgery medications reviewed with the patient.  Reproductive/Obstetrics                            Anesthesia Physical Anesthesia Plan  ASA: IV  Anesthesia Plan: General   Post-op Pain Management:    Induction: Intravenous  Airway Management Planned: Oral ETT  Additional Equipment: Arterial  line  Intra-op Plan:   Post-operative Plan: Possible Post-op intubation/ventilation  Informed Consent: I have reviewed the patients History and Physical, chart, labs and discussed the procedure including the risks, benefits and alternatives for the proposed anesthesia with the patient or authorized representative who has indicated his/her understanding and acceptance.   Dental advisory given  Plan Discussed with: CRNA  Anesthesia Plan Comments: (Risks/benefits of general anesthesia discussed with patient including risk of damage to teeth, lips, gum, and tongue, nausea/vomiting, allergic reactions to medications, and the possibility of heart attack, stroke and death.  All patient questions answered.  Patient wishes to proceed.)        Anesthesia Quick Evaluation                                  Anesthesia Evaluation  Patient identified by MRN, date of birth, ID band Patient awake    Reviewed: Allergy & Precautions, NPO status , Patient's Chart, lab work & pertinent test results  Airway Mallampati: II  TM Distance: >3 FB Neck ROM: Full    Dental no notable dental hx. (+) Edentulous Upper, Edentulous Lower, Lower Dentures, Upper Dentures   Pulmonary COPD, Current Smoker,    Pulmonary exam normal breath sounds clear to auscultation       Cardiovascular negative cardio ROS Normal cardiovascular exam Rhythm:Regular Rate:Normal     Neuro/Psych Aneurysm  CVA, No Residual Symptoms negative neurological ROS  negative psych ROS   GI/Hepatic negative GI ROS, Neg liver ROS,   Endo/Other  negative endocrine ROS  Renal/GU negative Renal  ROS  negative genitourinary   Musculoskeletal negative musculoskeletal ROS (+)   Abdominal   Peds negative pediatric ROS (+)  Hematology negative hematology ROS (+)   Anesthesia Other Findings   Reproductive/Obstetrics negative OB ROS                            Anesthesia Physical Anesthesia  Plan  ASA: III  Anesthesia Plan: General   Post-op Pain Management:    Induction: Intravenous  Airway Management Planned: LMA and Oral ETT  Additional Equipment:   Intra-op Plan:   Post-operative Plan: Extubation in OR  Informed Consent: I have reviewed the patients History and Physical, chart, labs and discussed the procedure including the risks, benefits and alternatives for the proposed anesthesia with the patient or authorized representative who has indicated his/her understanding and acceptance.   Dental advisory given  Plan Discussed with: CRNA  Anesthesia Plan Comments:         Anesthesia Quick Evaluation

## 2016-03-16 ENCOUNTER — Encounter (HOSPITAL_COMMUNITY): Payer: Self-pay | Admitting: Anesthesiology

## 2016-03-16 ENCOUNTER — Encounter (HOSPITAL_COMMUNITY)
Admission: RE | Disposition: A | Discharge status: Home / Self Care | Payer: Self-pay | Source: Ambulatory Visit | Attending: Interventional Radiology

## 2016-03-16 ENCOUNTER — Encounter (HOSPITAL_COMMUNITY): Payer: Self-pay | Admitting: General Practice

## 2016-03-16 ENCOUNTER — Inpatient Hospital Stay (HOSPITAL_COMMUNITY)
Admission: RE | Admit: 2016-03-16 | Discharge: 2016-03-17 | DRG: 027 | Disposition: A | Discharge status: Home / Self Care | Payer: BLUE CROSS/BLUE SHIELD | Source: Ambulatory Visit | Attending: Interventional Radiology | Admitting: Interventional Radiology

## 2016-03-16 ENCOUNTER — Encounter (HOSPITAL_COMMUNITY): Payer: Self-pay

## 2016-03-16 ENCOUNTER — Ambulatory Visit (HOSPITAL_COMMUNITY): Payer: BLUE CROSS/BLUE SHIELD | Admitting: Anesthesiology

## 2016-03-16 ENCOUNTER — Ambulatory Visit (HOSPITAL_COMMUNITY)
Admission: RE | Admit: 2016-03-16 | Discharge: 2016-03-16 | Disposition: A | Discharge status: Home / Self Care | Payer: BLUE CROSS/BLUE SHIELD | Source: Ambulatory Visit | Attending: Interventional Radiology | Admitting: Interventional Radiology

## 2016-03-16 DIAGNOSIS — Z7902 Long term (current) use of antithrombotics/antiplatelets: Secondary | ICD-10-CM

## 2016-03-16 DIAGNOSIS — F17200 Nicotine dependence, unspecified, uncomplicated: Secondary | ICD-10-CM | POA: Diagnosis present

## 2016-03-16 DIAGNOSIS — I671 Cerebral aneurysm, nonruptured: Secondary | ICD-10-CM

## 2016-03-16 DIAGNOSIS — E785 Hyperlipidemia, unspecified: Secondary | ICD-10-CM | POA: Diagnosis present

## 2016-03-16 DIAGNOSIS — F329 Major depressive disorder, single episode, unspecified: Secondary | ICD-10-CM | POA: Diagnosis present

## 2016-03-16 DIAGNOSIS — J449 Chronic obstructive pulmonary disease, unspecified: Secondary | ICD-10-CM | POA: Diagnosis present

## 2016-03-16 DIAGNOSIS — I739 Peripheral vascular disease, unspecified: Secondary | ICD-10-CM | POA: Diagnosis present

## 2016-03-16 DIAGNOSIS — Z7982 Long term (current) use of aspirin: Secondary | ICD-10-CM

## 2016-03-16 DIAGNOSIS — R0602 Shortness of breath: Secondary | ICD-10-CM

## 2016-03-16 HISTORY — PX: RADIOLOGY WITH ANESTHESIA: SHX6223

## 2016-03-16 LAB — CBC WITH DIFFERENTIAL/PLATELET
Basophils Absolute: 0 10*3/uL (ref 0.0–0.1)
Basophils Relative: 0 %
EOS PCT: 8 %
Eosinophils Absolute: 0.8 10*3/uL — ABNORMAL HIGH (ref 0.0–0.7)
HCT: 41.5 % (ref 36.0–46.0)
Hemoglobin: 13.7 g/dL (ref 12.0–15.0)
LYMPHS ABS: 0.9 10*3/uL (ref 0.7–4.0)
LYMPHS PCT: 9 %
MCH: 33 pg (ref 26.0–34.0)
MCHC: 33 g/dL (ref 30.0–36.0)
MCV: 100 fL (ref 78.0–100.0)
MONOS PCT: 10 %
Monocytes Absolute: 1 10*3/uL (ref 0.1–1.0)
Neutro Abs: 7.4 10*3/uL (ref 1.7–7.7)
Neutrophils Relative %: 73 %
PLATELETS: 305 10*3/uL (ref 150–400)
RBC: 4.15 MIL/uL (ref 3.87–5.11)
RDW: 13.4 % (ref 11.5–15.5)
WBC: 10.2 10*3/uL (ref 4.0–10.5)

## 2016-03-16 LAB — COMPREHENSIVE METABOLIC PANEL
ALK PHOS: 59 U/L (ref 38–126)
ALT: 12 U/L — AB (ref 14–54)
ANION GAP: 11 (ref 5–15)
AST: 13 U/L — ABNORMAL LOW (ref 15–41)
Albumin: 2.9 g/dL — ABNORMAL LOW (ref 3.5–5.0)
BUN: 6 mg/dL (ref 6–20)
CALCIUM: 8.2 mg/dL — AB (ref 8.9–10.3)
CO2: 23 mmol/L (ref 22–32)
CREATININE: 0.52 mg/dL (ref 0.44–1.00)
Chloride: 98 mmol/L — ABNORMAL LOW (ref 101–111)
Glucose, Bld: 98 mg/dL (ref 65–99)
Potassium: 3.5 mmol/L (ref 3.5–5.1)
Sodium: 132 mmol/L — ABNORMAL LOW (ref 135–145)
Total Bilirubin: 0.5 mg/dL (ref 0.3–1.2)
Total Protein: 5.7 g/dL — ABNORMAL LOW (ref 6.5–8.1)

## 2016-03-16 LAB — POCT ACTIVATED CLOTTING TIME
ACTIVATED CLOTTING TIME: 198 s
Activated Clotting Time: 219 seconds
Activated Clotting Time: 214 seconds

## 2016-03-16 LAB — HEPARIN LEVEL (UNFRACTIONATED)

## 2016-03-16 LAB — PLATELET INHIBITION P2Y12: PLATELET FUNCTION P2Y12: 21 [PRU] — AB (ref 194–418)

## 2016-03-16 LAB — PROTIME-INR
INR: 0.91 (ref 0.00–1.49)
PROTHROMBIN TIME: 12.5 s (ref 11.6–15.2)

## 2016-03-16 LAB — MRSA PCR SCREENING: MRSA BY PCR: NEGATIVE

## 2016-03-16 LAB — APTT: aPTT: 28 seconds (ref 24–37)

## 2016-03-16 SURGERY — RADIOLOGY WITH ANESTHESIA
Anesthesia: General

## 2016-03-16 MED ORDER — SODIUM CHLORIDE 0.9 % IV SOLN
INTRAVENOUS | Status: DC
  Administered 2016-03-16: 14:00:00 via INTRAVENOUS

## 2016-03-16 MED ORDER — LIDOCAINE HCL 1 % IJ SOLN
INTRAMUSCULAR | Status: AC
  Administered 2016-03-16: 10 mL
  Filled 2016-03-16: qty 20

## 2016-03-16 MED ORDER — ACETAMINOPHEN 500 MG PO TABS
1000.0000 mg | ORAL_TABLET | Freq: Four times a day (QID) | ORAL | Status: DC | PRN
  Administered 2016-03-16 – 2016-03-17 (×2): 1000 mg via ORAL
  Filled 2016-03-16 (×2): qty 2

## 2016-03-16 MED ORDER — ROCURONIUM BROMIDE 100 MG/10ML IV SOLN
INTRAVENOUS | Status: DC | PRN
  Administered 2016-03-16: 10 mg via INTRAVENOUS
  Administered 2016-03-16: 40 mg via INTRAVENOUS

## 2016-03-16 MED ORDER — PHENYLEPHRINE HCL 10 MG/ML IJ SOLN
10.0000 mg | INTRAMUSCULAR | Status: DC | PRN
  Administered 2016-03-16: 40 ug/min via INTRAVENOUS

## 2016-03-16 MED ORDER — SODIUM CHLORIDE 0.9 % IV SOLN: INTRAVENOUS | Status: DC

## 2016-03-16 MED ORDER — PHENYLEPHRINE HCL 10 MG/ML IJ SOLN
INTRAMUSCULAR | Status: DC | PRN
  Administered 2016-03-16 (×6): 80 ug via INTRAVENOUS
  Administered 2016-03-16: 40 ug via INTRAVENOUS
  Administered 2016-03-16: 80 ug via INTRAVENOUS

## 2016-03-16 MED ORDER — ARTIFICIAL TEARS OP OINT
TOPICAL_OINTMENT | OPHTHALMIC | Status: DC | PRN
  Administered 2016-03-16: 1 via OPHTHALMIC

## 2016-03-16 MED ORDER — VANCOMYCIN HCL IN DEXTROSE 1-5 GM/200ML-% IV SOLN
1000.0000 mg | INTRAVENOUS | Status: AC
  Administered 2016-03-16: 1000 mg via INTRAVENOUS
  Filled 2016-03-16: qty 200

## 2016-03-16 MED ORDER — ASPIRIN EC 325 MG PO TBEC
325.0000 mg | DELAYED_RELEASE_TABLET | ORAL | Status: DC
  Filled 2016-03-16: qty 1

## 2016-03-16 MED ORDER — IPRATROPIUM-ALBUTEROL 0.5-2.5 (3) MG/3ML IN SOLN
3.0000 mL | Freq: Every day | RESPIRATORY_TRACT | Status: DC | PRN
  Administered 2016-03-17: 3 mL via RESPIRATORY_TRACT
  Filled 2016-03-16: qty 3

## 2016-03-16 MED ORDER — CITALOPRAM HYDROBROMIDE 10 MG PO TABS: 40.0000 mg | ORAL_TABLET | Freq: Every evening | ORAL | Status: DC

## 2016-03-16 MED ORDER — CLOPIDOGREL BISULFATE 75 MG PO TABS
75.0000 mg | ORAL_TABLET | ORAL | Status: DC
  Filled 2016-03-16: qty 1

## 2016-03-16 MED ORDER — ONDANSETRON HCL 4 MG/2ML IJ SOLN: 4.0000 mg | Freq: Once | INTRAMUSCULAR | Status: DC | PRN

## 2016-03-16 MED ORDER — EPHEDRINE SULFATE 50 MG/ML IJ SOLN
INTRAMUSCULAR | Status: DC | PRN
  Administered 2016-03-16: 10 mg via INTRAVENOUS
  Administered 2016-03-16: 5 mg via INTRAVENOUS
  Administered 2016-03-16 (×2): 10 mg via INTRAVENOUS
  Administered 2016-03-16: 5 mg via INTRAVENOUS

## 2016-03-16 MED ORDER — ONDANSETRON HCL 4 MG/2ML IJ SOLN
4.0000 mg | Freq: Four times a day (QID) | INTRAMUSCULAR | Status: DC | PRN
Start: 2016-03-16 — End: 2016-03-17

## 2016-03-16 MED ORDER — ASPIRIN 81 MG PO CHEW
243.0000 mg | CHEWABLE_TABLET | Freq: Once | ORAL | Status: AC
  Administered 2016-03-16: 243 mg via ORAL
  Filled 2016-03-16: qty 3

## 2016-03-16 MED ORDER — NITROGLYCERIN 1 MG/10 ML FOR IR/CATH LAB
INTRA_ARTERIAL | Status: AC
  Filled 2016-03-16: qty 10

## 2016-03-16 MED ORDER — VANCOMYCIN HCL 1000 MG IV SOLR
1000.0000 mg | INTRAVENOUS | Status: DC
  Filled 2016-03-16: qty 1000

## 2016-03-16 MED ORDER — SUGAMMADEX SODIUM 200 MG/2ML IV SOLN
INTRAVENOUS | Status: DC | PRN
  Administered 2016-03-16: 96.2 mg via INTRAVENOUS

## 2016-03-16 MED ORDER — CLOPIDOGREL BISULFATE 75 MG PO TABS
37.5000 mg | ORAL_TABLET | Freq: Every day | ORAL | Status: DC
  Administered 2016-03-17: 37.5 mg via ORAL
  Filled 2016-03-16: qty 1

## 2016-03-16 MED ORDER — IOPAMIDOL (ISOVUE-300) INJECTION 61%
INTRAVENOUS | Status: AC
  Administered 2016-03-16: 60 mL
  Filled 2016-03-16: qty 150

## 2016-03-16 MED ORDER — HEPARIN (PORCINE) IN NACL 100-0.45 UNIT/ML-% IJ SOLN
650.0000 [IU]/h | INTRAMUSCULAR | Status: AC
  Administered 2016-03-16: 500 [IU]/h via INTRAVENOUS
  Filled 2016-03-16: qty 250

## 2016-03-16 MED ORDER — ONDANSETRON HCL 4 MG/2ML IJ SOLN
INTRAMUSCULAR | Status: DC | PRN
  Administered 2016-03-16: 4 mg via INTRAVENOUS

## 2016-03-16 MED ORDER — CITALOPRAM HYDROBROMIDE 10 MG PO TABS
60.0000 mg | ORAL_TABLET | Freq: Every evening | ORAL | Status: DC
  Administered 2016-03-16: 60 mg via ORAL
  Filled 2016-03-16: qty 6

## 2016-03-16 MED ORDER — FENTANYL CITRATE (PF) 100 MCG/2ML IJ SOLN
INTRAMUSCULAR | Status: DC | PRN
  Administered 2016-03-16: 50 ug via INTRAVENOUS
  Administered 2016-03-16: 100 ug via INTRAVENOUS

## 2016-03-16 MED ORDER — IOPAMIDOL (ISOVUE-300) INJECTION 61%
INTRAVENOUS | Status: AC
  Administered 2016-03-16: 10 mL
  Filled 2016-03-16: qty 100

## 2016-03-16 MED ORDER — NICARDIPINE HCL IN NACL 20-0.86 MG/200ML-% IV SOLN
5.0000 mg/h | INTRAVENOUS | Status: DC
  Administered 2016-03-17: 5 mg/h via INTRAVENOUS
  Administered 2016-03-17: 2.5 mg/h via INTRAVENOUS
  Filled 2016-03-16 (×2): qty 200

## 2016-03-16 MED ORDER — FENTANYL CITRATE (PF) 100 MCG/2ML IJ SOLN: 25.0000 ug | INTRAMUSCULAR | Status: DC | PRN

## 2016-03-16 MED ORDER — PROTAMINE SULFATE 10 MG/ML IV SOLN
INTRAVENOUS | Status: DC | PRN
  Administered 2016-03-16: 5 mg via INTRAVENOUS

## 2016-03-16 MED ORDER — HEPARIN (PORCINE) IN NACL 100-0.45 UNIT/ML-% IJ SOLN
INTRAMUSCULAR | Status: AC
  Filled 2016-03-16: qty 250

## 2016-03-16 MED ORDER — CLOPIDOGREL BISULFATE 75 MG PO TABS: 75.0000 mg | ORAL_TABLET | Freq: Every day | ORAL | Status: DC

## 2016-03-16 MED ORDER — VECURONIUM BROMIDE 10 MG IV SOLR
INTRAVENOUS | Status: DC | PRN
  Administered 2016-03-16: 2 mg via INTRAVENOUS

## 2016-03-16 MED ORDER — LACTATED RINGERS IV SOLN
INTRAVENOUS | Status: DC
  Administered 2016-03-16 (×4): via INTRAVENOUS

## 2016-03-16 MED ORDER — PROPOFOL 10 MG/ML IV BOLUS
INTRAVENOUS | Status: DC | PRN
  Administered 2016-03-16: 100 mg via INTRAVENOUS
  Administered 2016-03-16: 40 mg via INTRAVENOUS
  Administered 2016-03-16: 30 mg via INTRAVENOUS
  Administered 2016-03-16 (×2): 20 mg via INTRAVENOUS

## 2016-03-16 MED ORDER — NIMODIPINE 30 MG PO CAPS
0.0000 mg | ORAL_CAPSULE | ORAL | Status: AC
  Administered 2016-03-16: 60 mg via ORAL
  Filled 2016-03-16 (×2): qty 2

## 2016-03-16 MED ORDER — ASPIRIN 81 MG PO CHEW
CHEWABLE_TABLET | ORAL | Status: AC
  Filled 2016-03-16: qty 3

## 2016-03-16 MED ORDER — ATORVASTATIN CALCIUM 10 MG PO TABS
10.0000 mg | ORAL_TABLET | Freq: Every day | ORAL | Status: DC
  Administered 2016-03-16: 10 mg via ORAL
  Filled 2016-03-16: qty 1

## 2016-03-16 MED ORDER — HEPARIN SODIUM (PORCINE) 1000 UNIT/ML IJ SOLN
INTRAMUSCULAR | Status: DC | PRN
  Administered 2016-03-16: 3000 [IU] via INTRAVENOUS

## 2016-03-16 MED ORDER — ASPIRIN 325 MG PO TABS
325.0000 mg | ORAL_TABLET | Freq: Every day | ORAL | Status: DC
  Administered 2016-03-17: 325 mg via ORAL
  Filled 2016-03-16: qty 1

## 2016-03-16 MED ORDER — LIDOCAINE HCL (CARDIAC) 20 MG/ML IV SOLN
INTRAVENOUS | Status: DC | PRN
  Administered 2016-03-16: 100 mg via INTRAVENOUS

## 2016-03-16 MED ORDER — ACETAMINOPHEN 650 MG RE SUPP: 650.0000 mg | Freq: Four times a day (QID) | RECTAL | Status: DC | PRN

## 2016-03-16 NOTE — Sedation Documentation (Signed)
Anesthesia prepping pt

## 2016-03-16 NOTE — Anesthesia Procedure Notes (Signed)
Procedure Name: Intubation Date/Time: 03/16/2016 9:15 AM Performed by: Scheryl Darter Pre-anesthesia Checklist: Patient identified, Emergency Drugs available, Suction available and Patient being monitored Patient Re-evaluated:Patient Re-evaluated prior to inductionOxygen Delivery Method: Circle System Utilized Preoxygenation: Pre-oxygenation with 100% oxygen Intubation Type: IV induction Ventilation: Mask ventilation without difficulty Laryngoscope Size: Miller and 2 Grade View: Grade I Tube type: Oral Tube size: 7.0 mm Number of attempts: 1 Airway Equipment and Method: Stylet and Oral airway Placement Confirmation: ETT inserted through vocal cords under direct vision,  positive ETCO2 and breath sounds checked- equal and bilateral Secured at: 20 cm Tube secured with: Tape Dental Injury: Teeth and Oropharynx as per pre-operative assessment

## 2016-03-16 NOTE — Progress Notes (Signed)
ANTICOAGULATION CONSULT NOTE - Initial Consult  Pharmacy Consult for heparin Indication: post embolization  Allergies  Allergen Reactions  . Penicillins Anaphylaxis, Shortness Of Breath and Other (See Comments)    Has patient had a PCN reaction causing immediate rash, facial/tongue/throat swelling, SOB or lightheadedness with hypotension: Yes Has patient had a PCN reaction causing severe rash involving mucus membranes or skin necrosis: No Has patient had a PCN reaction that required hospitalization Yes Has patient had a PCN reaction occurring within the last 10 years: No If all of the above answers are "NO", then may proceed with Cephalosporin use.     Patient Measurements: Height: '5\' 4"'$  (162.6 cm) Weight: 106 lb (48.081 kg) IBW/kg (Calculated) : 54.7 Heparin Dosing Weight: 48.1kg  Vital Signs: Temp: 98 F (36.7 C) (05/08 1300) Temp Source: Oral (05/08 0648) BP: 98/56 mmHg (05/08 1300) Pulse Rate: 94 (05/08 1300)  Labs:  Recent Labs  03/16/16 0658  HGB 13.7  HCT 41.5  PLT 305  APTT 28  LABPROT 12.5  INR 0.91  CREATININE 0.52    Estimated Creatinine Clearance: 56.1 mL/min (by C-G formula based on Cr of 0.52).   Medical History: Past Medical History  Diagnosis Date  . Hyperlipidemia     takes Lipitor nightly  . Depression     takes Citalopram nightly  . Peripheral vascular disease ([place]) 20+ yrs ago  . COPD (chronic obstructive pulmonary disease) ([facility])     Combivent as needed  . Pneumonia Feb 14,2017  . History of bronchitis   . Smokers' cough ([place])   . Stroke ([place]) 12/2015    Medications:  Infusions:  . sodium chloride    . heparin 500 Units/hr (03/16/16 1250)  . lactated ringers 10 mL/hr at 03/16/16 6950    Assessment: 62 yof s/p cerebral angiogram initiated on IV heparin in PACU at 500 units/hr (~10units/kg/hr). Baseline CBC is WNL and she is not on anticoagulation PTA.   Goal of Therapy:  Heparin level 0.1-0.25 units/ml Monitor platelets by  anticoagulation protocol: Yes   Plan:  - Continue heparin gtt at 500 units/hr - Check an 8 hour heparin level *Heparin off at [place], Rande Lawman 03/16/2016,1:34 PM

## 2016-03-16 NOTE — Progress Notes (Signed)
ANTICOAGULATION CONSULT NOTE - Initial Consult  Pharmacy Consult for heparin Indication: post embolization  Allergies  Allergen Reactions  . Penicillins Anaphylaxis, Shortness Of Breath and Other (See Comments)    Has patient had a PCN reaction causing immediate rash, facial/tongue/throat swelling, SOB or lightheadedness with hypotension: Yes Has patient had a PCN reaction causing severe rash involving mucus membranes or skin necrosis: No Has patient had a PCN reaction that required hospitalization Yes Has patient had a PCN reaction occurring within the last 10 years: No If all of the above answers are "NO", then may proceed with Cephalosporin use.     Patient Measurements: Height: '5\' 4"'$  (162.6 cm) Weight: 113 lb 8.6 oz (51.5 kg) IBW/kg (Calculated) : 54.7 Heparin Dosing Weight: 48.1kg  Vital Signs: Temp: 98.6 F (37 C) (05/08 2000) Temp Source: Oral (05/08 2000) BP: 119/65 mmHg (05/08 2100) Pulse Rate: 88 (05/08 2100)  Labs:  Recent Labs  03/16/16 0658 03/16/16 2047  HGB 13.7  --   HCT 41.5  --   PLT 305  --   APTT 28  --   LABPROT 12.5  --   INR 0.91  --   HEPARINUNFRC  --  <0.10*  CREATININE 0.52  --     Estimated Creatinine Clearance: 60 mL/min (by C-G formula based on Cr of 0.52).   Medical History: Past Medical History  Diagnosis Date  . Hyperlipidemia     takes Lipitor nightly  . Depression     takes Citalopram nightly  . Peripheral vascular disease ([place]) 20+ yrs ago  . COPD (chronic obstructive pulmonary disease) (HCC)     Combivent as needed  . Pneumonia Feb 14,2017  . History of bronchitis   . Smokers' cough ([place])   . Stroke ([place]) 12/2015    Medications:  Infusions:  . sodium chloride 75 mL/hr at 03/16/16 2100  . heparin 500 Units/hr (03/16/16 2000)  . lactated ringers Stopped (03/16/16 1330)  . niCARDipine      Assessment: 62 yof s/p cerebral angiogram initiated on IV heparin in PACU at 500 units/hr (~10units/kg/hr). Baseline CBC is  WNL and she is not on anticoagulation PTA.   HL came back undetectable. Will increase heparin gtt slightly and recheck before stopping prior to procedure.  Goal of Therapy:  Heparin level 0.1-0.25 units/ml Monitor platelets by anticoagulation protocol: Yes   Plan:  Increase heparin gtt to 550 units/hr Check 6 hr HL *Heparin off at [place], PharmD, BCPS Clinical Pharmacist Pager [phone] 03/16/2016 9:41 PM

## 2016-03-16 NOTE — Progress Notes (Signed)
Referring Physician(s): Hollice Gong  Supervising Physician: Luanne Bras  Patient Status: In-pt  Chief Complaint:  L ICA aneurysm pipeline stent placement 5/8  Subjective:  Doing well Denies headache Denies N/V No complaints resting  Allergies: Penicillins  Medications: Prior to Admission medications   Medication Sig Start Date End Date Taking? Authorizing Provider  aspirin EC 325 MG tablet Take 325 mg by mouth daily.   Yes Historical Provider, MD  aspirin EC 81 MG tablet Take 81 mg by mouth daily. Reported on 03/12/2016   Yes Historical Provider, MD  atorvastatin (LIPITOR) 10 MG tablet Take 10 mg by mouth at bedtime. 12/15/15  Yes Historical Provider, MD  citalopram (CELEXA) 20 MG tablet Take 20 mg by mouth every evening. Take 20 mg along with 40 mg to equal 60 mg nightly 12/15/15  Yes Historical Provider, MD  citalopram (CELEXA) 40 MG tablet Take 40 mg by mouth every evening. Take 40 mg along with 20 mg to equal 60 mg nightly 12/15/15  Yes Historical Provider, MD  clopidogrel (PLAVIX) 75 MG tablet Take 75 mg by mouth daily.   Yes Historical Provider, MD  COMBIVENT RESPIMAT 20-100 MCG/ACT AERS respimat Inhale 1-2 puffs into the lungs daily as needed for wheezing.  02/01/16  Yes Historical Provider, MD  Vitamin D, Ergocalciferol, (DRISDOL) 50000 units CAPS capsule Take 50,000 Units by mouth every 14 (fourteen) days. 12/15/15  Yes Historical Provider, MD     Vital Signs: BP 93/54 mmHg  Pulse 83  Temp(Src) 98.2 F (36.8 C) (Oral)  Resp 11  Ht '5\' 4"'$  (1.626 m)  Wt 113 lb 8.6 oz (51.5 kg)  BMI 19.48 kg/m2  SpO2 92%  Physical Exam  Constitutional: She is oriented to person, place, and time. She appears well-nourished.  HENT:  Tongue midline Face symmetrical  Eyes: EOM are normal.  Neck: Neck supple.  Cardiovascular: Normal rate, regular rhythm and normal heart sounds.   Pulmonary/Chest: Effort normal and breath sounds normal. She has no wheezes.  Abdominal: Soft.  Bowel sounds are normal.  Musculoskeletal: Normal range of motion.  moves all 4s= Rt groin NT no bleeding Rt foot 2+ pulses   Neurological: She is alert and oriented to person, place, and time.  Skin: Skin is warm and dry.  Psychiatric: She has a normal mood and affect. Her behavior is normal. Judgment and thought content normal.  Nursing note and vitals reviewed.   Imaging: No results found.  Labs:  CBC:  Recent Labs  01/17/16 0655 02/14/16 0827 03/16/16 0658  WBC 12.3* 12.2* 10.2  HGB 16.2* 15.0 13.7  HCT 47.5* 42.6 41.5  PLT 267 271 305    COAGS:  Recent Labs  01/17/16 0655 03/16/16 0658  INR 0.93 0.91  APTT 29 28    BMP:  Recent Labs  01/17/16 0655 02/14/16 0827 02/18/16 0832 03/16/16 0658  NA 136 129* 132* 132*  K 3.5 4.7 4.2 3.5  CL 95* 93* 98* 98*  CO2 '25 26 23 23  '$ GLUCOSE 83 101* 98 98  BUN '7 6 7 6  '$ CALCIUM 9.7 9.1 9.1 8.2*  CREATININE 0.61 0.60 0.51 0.52  GFRNONAA >60 >60 >60 >60  GFRAA >60 >60 >60 >60    LIVER FUNCTION TESTS:  Recent Labs  03/16/16 0658  BILITOT 0.5  AST 13*  ALT 12*  ALKPHOS 59  PROT 5.7*  ALBUMIN 2.9*    Assessment and Plan:  L ICA aneurysm pipeline stent Doing well Plan for dc in am  Electronically  Signed: Zacari Radick A 03/16/2016, 3:01 PM   I spent a total of 15 Minutes at the the patient's bedside AND on the patient's hospital floor or unit, greater than 50% of which was counseling/coordinating care for L ICA aneurysm stent

## 2016-03-16 NOTE — Anesthesia Postprocedure Evaluation (Signed)
Anesthesia Post Note  Patient: Kylie Valencia  Procedure(s) Performed: Procedure(s) (LRB): EMBOLIZATION (N/A)  Patient location during evaluation: PACU Anesthesia Type: General Level of consciousness: awake and alert Pain management: pain level controlled Vital Signs Assessment: post-procedure vital signs reviewed and stable Respiratory status: spontaneous breathing, nonlabored ventilation, respiratory function stable and patient connected to nasal cannula oxygen Cardiovascular status: blood pressure returned to baseline and stable Postop Assessment: no signs of nausea or vomiting Anesthetic complications: no    Last Vitals:  Filed Vitals:   03/16/16 1400 03/16/16 1415  BP: 106/56 93/54  Pulse: 89 83  Temp:    Resp: 17 11    Last Pain: There were no vitals filed for this visit.               Catalina Gravel

## 2016-03-16 NOTE — H&P (Signed)
Chief Complaint: Patient was seen in consultation today for cerebral arteriogram with Left internal carotid artery aneurysm embolization at the request of Dr Rosemarie Beath  Referring Physician(s): Dr Rosemarie Beath  Supervising Physician: Luanne Bras  Patient Status: Out-pt  History of Present Illness: Kylie Valencia is a 60 y.o. 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  Approximately 3.5 x 3 mm aneurysm arising at the petrous cavernous junction of the right internal carotid artery associated with a dysplastic internal carotid artery at the site of the aneurysm.  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 Single feeder arising from the left vertebral artery at the level of C6 supplies this intraspinal extramedullary dural AV Fistula.  Consulted with Dr Estanislado Pandy 4/18 to discuss findings and possible treatment Now scheduled for L ICA aneurysm embolization P2Y12  9--- on 5/5 Plavix changed to half tablet daily and ASA changed to 81 mg daily Rechecking now  Past Medical History  Diagnosis Date  . Hyperlipidemia     takes Lipitor nightly  . Depression     takes Citalopram nightly  . Peripheral vascular disease ([place]) 20+ yrs ago  . COPD (chronic obstructive pulmonary disease) ([facility])     Combivent as needed  . Pneumonia Feb 14,2017  . History of bronchitis   . Smokers' cough ([place])   . Stroke [facility]) 12/2015    Past Surgical History  Procedure Laterality Date  . Tubal ligation    . Aorto-femoral bypass graft    . Colonoscopy    . Radiology with anesthesia N/A 02/18/2016   Procedure: MRI OF BRAIN WITH AND WITHOUT CONTRAST, MRI OF CERVICAL SPINE;  Surgeon: Luanne Bras, MD;  Location: [place];  Service: Radiology;  Laterality: N/A;    Allergies: Penicillins  Medications: Prior to Admission medications   Medication Sig Start Date End Date Taking? Authorizing Provider  aspirin EC 325 MG tablet Take 325 mg by mouth daily.    Historical Provider, MD  aspirin EC 81 MG tablet Take 81 mg by mouth daily. Reported on 03/12/2016    Historical Provider, MD  atorvastatin (LIPITOR) 10 MG tablet Take 10 mg by mouth at bedtime. 12/15/15   Historical Provider, MD  citalopram (CELEXA) 20 MG tablet Take 20 mg by mouth every evening. Take 20 mg along with 40 mg to equal 60 mg nightly 12/15/15   Historical Provider, MD  citalopram (CELEXA) 40 MG tablet Take 40 mg by mouth every evening. Take 40 mg along with 20 mg to equal 60 mg nightly 12/15/15   Historical Provider, MD  clopidogrel (PLAVIX) 75 MG tablet Take 75 mg by mouth daily.    Historical Provider, MD  COMBIVENT RESPIMAT 20-100 MCG/ACT AERS respimat Inhale 1-2 puffs into the lungs daily as needed for wheezing.  02/01/16   Historical Provider, MD  Vitamin D, Ergocalciferol, (DRISDOL) 50000 units CAPS capsule Take 50,000 Units by mouth every 14 (fourteen) days. 12/15/15   Historical Provider, MD     Family History  Problem Relation Age of Onset  . Adopted: Yes    Social History   Social History  . Marital Status: Divorced    Spouse Name: N/A  . Number of Children:  N/A  . Years of Education: N/A   Social History Main Topics  . Smoking status: Current Every Day Smoker -- 0.50 packs/day for 30 years  . Smokeless tobacco: None  . Alcohol Use: Yes     Comment: 3 beers a day  . Drug Use: No  . Sexual Activity: Not Asked   Other Topics Concern  . None   Social History Narrative      Review of Systems: A 12 point ROS discussed and pertinent positives are indicated in the HPI above.  All other systems are  negative.  Review of Systems  Constitutional: Negative for fever, activity change, appetite change and fatigue.  HENT: Negative for hearing loss and tinnitus.   Eyes: Negative for visual disturbance.  Gastrointestinal: Negative for abdominal pain.  Musculoskeletal: Negative for back pain.  Neurological: Positive for headaches. Negative for dizziness, tremors, seizures, syncope, facial asymmetry, speech difficulty, weakness, light-headedness and numbness.  Psychiatric/Behavioral: Negative for behavioral problems and confusion.    Vital Signs: There were no vitals taken for this visit.  Physical Exam  Constitutional: She is oriented to person, place, and time. She appears well-nourished.  Eyes: EOM are normal.  Neck: Normal range of motion.  Cardiovascular: Normal rate, regular rhythm and normal heart sounds.   No murmur heard. Pulmonary/Chest: Effort normal. She has no wheezes.  Abdominal: Soft. Bowel sounds are normal. There is no tenderness.  Musculoskeletal: Normal range of motion. She exhibits no edema or tenderness.  Neurological: She is alert and oriented to person, place, and time.  Skin: Skin is warm and dry.  Psychiatric: She has a normal mood and affect. Her behavior is normal. Judgment and thought content normal.  Nursing note and vitals reviewed.   Mallampati Score:  MD Evaluation Airway: WNL Heart: WNL Abdomen: WNL Chest/ Lungs: WNL ASA  Classification: 3 Mallampati/Airway Score: One  Imaging: Dg Chest 2 View  02/18/2016  CLINICAL DATA:  Pneumonia EXAM: CHEST  2 VIEW COMPARISON:  12/25/2015 FINDINGS: Cardiomediastinal silhouette is stable. Persistent consolidation/pneumonia in left lower lobe posterior medially. Possible central cavitation. No pulmonary edema. Mild hyperinflation. IMPRESSION: Persistent consolidation/ pneumonia in left lower lobe posteromedially. Possible central cavitation. Mild hyperinflation. Electronically Signed   By: Lahoma Crocker M.D.   On:  02/18/2016 08:57   Mr Jeri Cos PJ Contrast  02/18/2016  CLINICAL DATA:  Spinal dural AV fistula and intracranial saccular aneurysms recently evaluated by catheter angiography EXAM: MRI HEAD WITHOUT AND WITH CONTRAST MRI CERVICAL SPINE WITHOUT AND WITH CONTRAST TECHNIQUE: Multiplanar, multiecho pulse sequences of the brain and surrounding structures, and cervical spine, to include the craniocervical junction and cervicothoracic junction, were obtained without and with intravenous contrast. CONTRAST:  62m MULTIHANCE GADOBENATE DIMEGLUMINE 529 MG/ML IV SOLN COMPARISON:  None. FINDINGS: MRI HEAD FINDINGS Calvarium and upper cervical spine: No focal marrow signal abnormality. Orbits: Negative. Sinuses and Mastoids: Clear. Brain: Small superficial right cerebellar acute infarct incidentally detected. Patchy white matter FLAIR hyperintensity in the cerebral hemispheres compatible chronic small vessel disease. Small-vessel ischemic changes also present in the pons. There have been remote bilateral small-vessel cerebellar infarcts. Known bilateral intracranial ICA aneurysms, petrous/cavernous segment on the right and paraclinoid segment on the left. These are best seen on precontrast T1 weighted imaging. MRI CERVICAL SPINE FINDINGS Abnormal large and tortuous intradural vessels dorsal and left of the cord, extending from the foramen magnum to the C5 level at least. These are most confluent at the level of C4 and C5 left of the cord. Dilated anterior  spinal artery or vein, recently characterized by catheter angiography. There is no ischemic or gliotic changes in the cord. No syrinx or atrophy. No marrow signal abnormality suggestive of fracture, infection, or neoplasm. Craniocervical junction: Negative. C2-3: Negative C3-4: Disc: No herniation. Facets: Arthropathy with spurring Canal: Patent. Foramina: Mild bilateral foraminal narrowing from facet overgrowth C4-5: Disc: No herniation.  Uncovertebral spurring Facets:  Arthropathy with spurring and ligamentum flavum thickening Canal: Partial circumferential effacement of CSF without cord compression. Foramina: Bilateral advanced stenosis C5-6: Disc: No herniation. Facets: Facet arthropathy with bony overgrowth and ligamentum flavum thickening/buckling Canal: Partial circumferential effacement of CSF without cord compression. Foramina: Moderate bilateral stenosis C6-7: Disc: No herniation. Facets: Arthropathy with bilateral overgrowth and ligamentum flavum thickening Canal: Patent. Foramina: Bilateral narrowing with mild to moderate stenosis. C7-T1: Negative These results will be called to the ordering clinician or representative by the Radiologist Assistant, and communication documented in the PACS or zVision Dashboard. IMPRESSION: 1. Dilated intradural cervical vessels correlating with known spinal dural AV fistula. No cord signal abnormality to suggest ischemia or congestion. No cord atrophy. 2. Punctate acute infarct in the right cerebellum superimposed on moderate chronic small vessel disease. 3. Known bilateral ICA saccular aneurysm. 4. 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 Electronically Signed   By: Monte Fantasia M.D.   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 CONTRAST:  17m MULTIHANCE GADOBENATE DIMEGLUMINE 529 MG/ML IV SOLN COMPARISON:  None. FINDINGS: MRI HEAD FINDINGS Calvarium and upper cervical spine: No focal marrow signal  abnormality. Orbits: Negative. Sinuses and Mastoids: Clear. Brain: Small superficial right cerebellar acute infarct incidentally detected. Patchy white matter FLAIR hyperintensity in the cerebral hemispheres compatible chronic small vessel disease. Small-vessel ischemic changes also present in the pons. There have been remote bilateral small-vessel cerebellar infarcts. Known bilateral intracranial ICA aneurysms, petrous/cavernous segment on the right and paraclinoid segment on the left. These are best seen on precontrast T1 weighted imaging. MRI CERVICAL SPINE FINDINGS Abnormal large and tortuous intradural vessels dorsal and left of the cord, extending from the foramen magnum to the C5 level at least. These are most confluent at the level of C4 and C5 left of the cord. Dilated anterior spinal artery or vein, recently characterized by catheter angiography. There is no ischemic or gliotic changes in the cord. No syrinx or atrophy. No marrow signal abnormality suggestive of fracture, infection, or neoplasm. Craniocervical junction: Negative. C2-3: Negative C3-4: Disc: No herniation. Facets: Arthropathy with spurring Canal: Patent. Foramina: Mild bilateral foraminal narrowing from facet overgrowth C4-5: Disc: No herniation.  Uncovertebral spurring Facets: Arthropathy with spurring and ligamentum flavum thickening Canal: Partial circumferential effacement of CSF without cord compression. Foramina: Bilateral advanced stenosis C5-6: Disc: No herniation. Facets: Facet arthropathy with bony overgrowth and ligamentum flavum thickening/buckling Canal: Partial circumferential effacement of CSF without cord compression. Foramina: Moderate bilateral stenosis C6-7: Disc: No herniation. Facets: Arthropathy with bilateral overgrowth and ligamentum flavum thickening Canal: Patent. Foramina: Bilateral narrowing with mild to moderate stenosis. C7-T1: Negative These results will be called to the ordering clinician or representative by  the Radiologist Assistant,  and communication documented in the PACS or zVision Dashboard. IMPRESSION: 1. Dilated intradural cervical vessels correlating with known spinal dural AV fistula. No cord signal abnormality to suggest ischemia or congestion. No cord atrophy. 2. Punctate acute infarct in the right cerebellum superimposed on moderate chronic small vessel disease. 3. Known bilateral ICA saccular aneurysm. 4. 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 Electronically Signed   By: Monte Fantasia M.D.   On: 02/18/2016 13:55   Ir Radiologist Eval & Mgmt  03/04/2016  EXAM: ESTABLISHED PATIENT OFFICE VISIT CHIEF COMPLAINT: No significant complaints. Current Pain Level: 1-10 HISTORY OF PRESENT ILLNESS: The patient is a 60 year old, right-handed lady who returns to discuss the results of her recently done MRI of the brain and MRI of the cervical spine. The patient is accompanied by her daughter and her sisters. Clinically, the patient reports no new neurological symptoms or signs. Specifically, no further symptoms of severe sudden headache, nausea, vomiting, visual aberrations, diplopia, speech difficulties, motor weakness, sensory symptoms or of incoordination. The patient denies any further episodes of syncope. Constitutionally, also patient denies recent chills fever or rigors. She denies any symptoms of chest pains, shortness of breath, wheezing, hemoptysis, ankle swelling, or of paroxysmal nocturnal dyspnea. Her weight is steady. Her appetite is within normal limits. She denies any UTI symptoms of dysuria, polyuria or hematuria. 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 Present Medications: Aspirin 81 mg a day. Atorvastatin. Celexa. Combivent Respimat. Vitamin-D, Ergocalciferol, Drisdol capsules. Allergies:  Penicillins, causes anaphylaxis and shortness of breath. PHYSICAL EXAMINATION: In no acute distress.  Affect normal. Neurologically otherwise intact. ASSESSMENT AND PLAN: The patient's recent MRI of the brain and MRI of the neck was reviewed in combination with the recent catheter angiogram which she had earlier. Brought to her attention was the presence of a vascular dural malformation being fed from the left vertebral artery at the skull base. This was seen in conjunction with the MRI of the cervical spine. The cervical spine MRI demonstrated no evidence of cord edema or changes related to myelomalacia. The cerebellar tonsils were seen at the level of the foramen magnum. The spinal canal appears patent and adequate. Also discussed was the left internal carotid artery superior hypophyseal region aneurysm, lobulated irregular aneurysm with a wide neck. The aneurysm measures approximately 4.5 x 3.7 mm. Also reviewed was the right internal carotid artery of 3.5 x 3 mm aneurysm arising at the petrous cavernous junction associated with mild fusiform dilatation and dysplasia of the parent internal carotid artery. 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 However the left internal carotid artery superior hypophsyeal aneurysm given its location and its configuration could be treated to remove the risk of rupture of 1-2% per year with the attendant significant mortality and morbidity. The endovascular option was reviewed in detail. This may include a primary coiling versus probable treatment with stent assisted coiling or using a flow diverter device given the wide neck of the aneurysm. The procedure, the risks, benefits were all reviewed in detail. Questions were answered to their satisfaction. The risk of a complication of thromboembolic event of approximately 1% in the periprocedural period, and of a delayed hemorrhage in the territory  of the treated vessel days to weeks after the treatment were also reviewed. Patient was informed that  should she decide to have the left internal carotid artery superior hypophyseal region aneurysm treated endovascularly, she would probably need to be on dual anti-platelet therapy for at least 5 days prior to procedure and at least 6-8 weeks thereafter. The patient would be monitored with blood tests of P2Y12 levels with appropriate adjustment as needed. Patient was also strongly advised to stop smoking as this was a risk factor in increase in size and also the risk of rupture of an intracranial aneurysm. The patient and family would like to proceed with endovascular treatment of the left internal carotid artery superior hypophyseal region aneurysm. To this affect, the patient was provided with a prescription of Plavix to start fiv days prior to the intervention under general anesthesia. Should the patient develop increased bruisability or hemorrhage, she was asked to call promptly. Patient and the family leave with good understanding and agreement with above management plan. The treatment of the aneurysm will be scheduled at the earliest possible. Electronically Signed   By: Luanne Bras M.D.   On: 02/25/2016 12:44    Labs:  CBC:  Recent Labs  01/17/16 0655 02/14/16 0827 03/16/16 0658  WBC 12.3* 12.2* 10.2  HGB 16.2* 15.0 13.7  HCT 47.5* 42.6 41.5  PLT 267 271 305    COAGS:  Recent Labs  01/17/16 0655 03/16/16 0658  INR 0.93 0.91  APTT 29 28    BMP:  Recent Labs  01/17/16 0655 02/14/16 0827 02/18/16 0832 03/16/16 0658  NA 136 129* 132* 132*  K 3.5 4.7 4.2 3.5  CL 95* 93* 98* 98*  CO2 '25 26 23 23  '$ GLUCOSE 83 101* 98 98  BUN '7 6 7 6  '$ CALCIUM 9.7 9.1 9.1 8.2*  CREATININE 0.61 0.60 0.51 0.52  GFRNONAA >60 >60 >60 >60  GFRAA >60 >60 >60 >60    LIVER FUNCTION TESTS:  Recent Labs  03/16/16 0658  BILITOT 0.5  AST 13*  ALT 12*  ALKPHOS 59  PROT 5.7*  ALBUMIN  2.9*    TUMOR MARKERS: No results for input(s): AFPTM, CEA, CA199, CHROMGRNA in the last 8760 hours.  Assessment and Plan:  Left internal carotid artery aneurysm Scheduled for embolization in IR today with Dr Estanislado Pandy Awaiting P2Y12 result now If proceed with intervention today; pt will be admitted overnight into Neuro ICU and plan for discharge in am She is aware and agreeable Risks and Benefits discussed with the patient including, but not limited to bleeding, infection, vascular injury, contrast induced renal failure, stroke or even death. All of the patient's questions were answered, patient is agreeable to proceed. Consent signed and in chart.  Thank you for this interesting consult.  I greatly enjoyed meeting Kylie Valencia and look forward to participating in their care.  A copy of this report was sent to the requesting provider on this date.  Electronically Signed: Monia Sabal A 03/16/2016, 7:54 AM   I spent a total of  30 Minutes   in face to face in clinical consultation, greater than 50% of which was counseling/coordinating care for L ICA aneurysm embolization

## 2016-03-16 NOTE — Procedures (Signed)
S/p LT ICA superior hypophyseal aneurysm embolization using the pipeline flow diverter device

## 2016-03-16 NOTE — Transfer of Care (Signed)
Immediate Anesthesia Transfer of Care Note  Patient: Kylie Valencia  Procedure(s) Performed: Procedure(s): EMBOLIZATION (N/A)  Patient Location: PACU  Anesthesia Type:General  Level of Consciousness: awake, alert , oriented and sedated  Airway & Oxygen Therapy: Patient Spontanous Breathing and Patient connected to nasal cannula oxygen  Post-op Assessment: Report given to RN, Post -op Vital signs reviewed and stable and Patient moving all extremities  Post vital signs: Reviewed and stable  Last Vitals:  Filed Vitals:   03/16/16 0648 03/16/16 1230  BP: 141/66   Pulse: 81   Temp: 36.7 C 36.8 C  Resp: 20     Last Pain: There were no vitals filed for this visit.    Patients Stated Pain Goal: 4 (45/62/56 3893)  Complications: No apparent anesthesia complications

## 2016-03-16 NOTE — Sedation Documentation (Signed)
Right groin sheath pulled and pressure held by Melchor Amour, RT.  VPad used.  Manual pressuer being held.

## 2016-03-17 ENCOUNTER — Encounter (HOSPITAL_COMMUNITY): Payer: Self-pay | Admitting: Interventional Radiology

## 2016-03-17 ENCOUNTER — Inpatient Hospital Stay (HOSPITAL_COMMUNITY): Payer: BLUE CROSS/BLUE SHIELD

## 2016-03-17 LAB — POCT I-STAT 3, ART BLOOD GAS (G3+)
Acid-Base Excess: 1 mmol/L (ref 0.0–2.0)
Bicarbonate: 25.1 mEq/L — ABNORMAL HIGH (ref 20.0–24.0)
O2 SAT: 89 %
PCO2 ART: 38.9 mmHg (ref 35.0–45.0)
PH ART: 7.417 (ref 7.350–7.450)
PO2 ART: 56 mmHg — AB (ref 80.0–100.0)
Patient temperature: 98.3
TCO2: 26 mmol/L (ref 0–100)

## 2016-03-17 LAB — BASIC METABOLIC PANEL
ANION GAP: 8 (ref 5–15)
BUN: <5 mg/dL — ABNORMAL LOW (ref 6–20)
CHLORIDE: 101 mmol/L (ref 101–111)
CO2: 25 mmol/L (ref 22–32)
Calcium: 8.1 mg/dL — ABNORMAL LOW (ref 8.9–10.3)
Creatinine, Ser: 0.39 mg/dL — ABNORMAL LOW (ref 0.44–1.00)
GFR calc Af Amer: >60 mL/min (ref >60)
Glucose, Bld: 109 mg/dL — ABNORMAL HIGH (ref 65–99)
POTASSIUM: 3.4 mmol/L — AB (ref 3.5–5.1)
SODIUM: 134 mmol/L — AB (ref 135–145)

## 2016-03-17 LAB — HEPARIN LEVEL (UNFRACTIONATED)

## 2016-03-17 LAB — CBC WITH DIFFERENTIAL/PLATELET
BASOS ABS: 0 10*3/uL (ref 0.0–0.1)
Basophils Relative: 0 %
EOS PCT: 5 %
Eosinophils Absolute: 0.5 10*3/uL (ref 0.0–0.7)
HEMATOCRIT: 38.3 % (ref 36.0–46.0)
HEMOGLOBIN: 12.5 g/dL (ref 12.0–15.0)
LYMPHS ABS: 0.7 10*3/uL (ref 0.7–4.0)
LYMPHS PCT: 7 %
MCH: 33 pg (ref 26.0–34.0)
MCHC: 32.6 g/dL (ref 30.0–36.0)
MCV: 101.1 fL — AB (ref 78.0–100.0)
Monocytes Absolute: 0.7 10*3/uL (ref 0.1–1.0)
Monocytes Relative: 7 %
NEUTROS ABS: 8.9 10*3/uL — AB (ref 1.7–7.7)
NEUTROS PCT: 81 %
Platelets: 273 10*3/uL (ref 150–400)
RBC: 3.79 MIL/uL — ABNORMAL LOW (ref 3.87–5.11)
RDW: 13.5 % (ref 11.5–15.5)
WBC: 10.8 10*3/uL — AB (ref 4.0–10.5)

## 2016-03-17 LAB — PLATELET INHIBITION P2Y12: Platelet Function  P2Y12: 94 [PRU] — ABNORMAL LOW (ref 194–418)

## 2016-03-17 MED ORDER — IPRATROPIUM-ALBUTEROL 0.5-2.5 (3) MG/3ML IN SOLN
3.0000 mL | Freq: Three times a day (TID) | RESPIRATORY_TRACT | Status: DC
  Administered 2016-03-17: 3 mL via RESPIRATORY_TRACT
  Filled 2016-03-17 (×2): qty 3

## 2016-03-17 MED ORDER — IPRATROPIUM-ALBUTEROL 0.5-2.5 (3) MG/3ML IN SOLN: 3.0000 mL | RESPIRATORY_TRACT | Status: DC | PRN

## 2016-03-17 NOTE — Discharge Summary (Signed)
Physician Discharge Summary      Patient ID: Kylie Valencia MRN: [id number] DOB/AGE: [birth year] 61 y.o.  Admit date: 03/16/2016 Discharge date: 03/17/2016  Admission Diagnoses: Active Problems:   Brain aneurysm  Discharge Diagnoses:  Active Problems:   Brain aneurysm    Procedures: Procedure(s):S/p LT ICA superior hypophyseal aneurysm embolization using the pipeline flow diverter device    Discharged Condition: good  Hospital Course: Admitted to Neuro ICU post procedure as above. No immediate complications. Had on/off BP issues overnight, Cardene drip finally discontinued morning of POD #1. Some respiratory issues with O2 desats, likely from chronic COPD. Once pt had Combivent nebulizer, some Incentive spiro and OOB, breathing improved and sats back to baseline. CXR ordered as precaution suggested LLL opacity/infiltrate but no others findings to support active PNA. Her neuro exam is unremarkable and she is determined stable for discharge. Follow up plans, medications, and instructions reviewed.   Consults: None   Discharge Exam: Blood pressure 138/67, pulse 88, temperature 98.1 F (36.7 C), temperature source Oral, resp. rate 21, height '5\' 4"'$  (1.626 m), weight 113 lb 8.6 oz (51.5 kg), SpO2 91 %. General: A&O x 3, no distress. ENT: unremarkable Lungs: scattered rhonchi Heart: RRR Abd: soft, NT Ext: (R)groin access site clean, soft, No hematoma Neuro: Face symmetric, tongue midline PERRLA, EOMI No drift Fine motor intact and equal Strength 5/5 bilat.    Disposition: 01-Home or Self Care  Discharge Instructions    Call MD for:  difficulty breathing, headache or visual disturbances    Complete by:  As directed      Call MD for:  persistant nausea and vomiting    Complete by:  As directed      Call MD for:  redness, tenderness, or signs of infection (pain, swelling, redness, odor or green/yellow discharge around incision site)    Complete by:  As directed      Call MD for:  severe uncontrolled pain    Complete by:  As directed      Call MD for:  temperature >100.4    Complete by:  As directed      Diet - low sodium heart healthy    Complete by:  As directed      Increase activity slowly    Complete by:  As directed      May shower / Bathe    Complete by:  As directed      May walk up steps    Complete by:  As directed      No wound care    Complete by:  As directed             Medication List    TAKE these medications        aspirin EC 81 MG tablet  Take 81 mg by mouth daily. Reported on 03/12/2016     aspirin EC 325 MG tablet  Take 325 mg by mouth daily.     atorvastatin 10 MG tablet  Commonly known as:  LIPITOR  Take 10 mg by mouth at bedtime.     citalopram 20 MG tablet  Commonly known as:  CELEXA  Take 20 mg by mouth every evening. Take 20 mg along with 40 mg to equal 60 mg nightly     citalopram 40 MG tablet  Commonly known as:  CELEXA  Take 40 mg by mouth every evening. Take 40 mg along with 20 mg to equal 60 mg nightly     clopidogrel 75  MG tablet  Commonly known as:  PLAVIX  Take 75 mg by mouth daily.     COMBIVENT RESPIMAT 20-100 MCG/ACT Aers respimat  Generic drug:  Ipratropium-Albuterol  Inhale 1-2 puffs into the lungs daily as needed for wheezing.     Vitamin D (Ergocalciferol) 50000 units Caps capsule  Commonly known as:  DRISDOL  Take 50,000 Units by mouth every 14 (fourteen) days.           Follow-up Information    Follow up with DEVESHWAR, Fritz Pickerel, MD. Go in 2 weeks.   Specialty:  Interventional Radiology   Why:  Post procedure eval   Contact information:   [address] [place] [address] [phone]       Signed: Ascencion Dike PA-C 03/17/2016, 12:01 PM

## 2016-03-17 NOTE — Progress Notes (Signed)
Ambulated one full lap around unit 30M (approx. 180 ft.) w/o assistance.  SaO2 88-90% on room air while ambulating.  Tolerated well, mild subjective dyspnea upon return to room after walking.

## 2016-03-17 NOTE — Progress Notes (Addendum)
49 - RN assessed patients O2 saturations in the low 80's on monitor with 4L [place] in place. Upon assessment patient is resting in bed in no apparent distress. Patient pulled up in bed with HOB elevated. Nonproductive cough noted. Venturi mask placed and titrated up as needed. Patient states "its hard to get a good deep breath, and my chest feels tight." PRN albuterol administered with relief of tightness in chest. Lung sounds remain clear and diminished bilaterally, with no change from shift assessment. Patient remained in no distress throughout episode of desaturation. Patient remains on venturi mask and continues to be titrated down as she can tolerate it. Will continue to monitor. Lianne Bushy RN BSN.

## 2016-03-17 NOTE — Progress Notes (Signed)
All lines & dressings removed, AVS w/ discharge instructions and follow-up arrangements reviewed with pt and family.  Discharged to home.

## 2016-03-17 NOTE — Progress Notes (Signed)
ANTICOAGULATION CONSULT NOTE - Follow-up Consult  Pharmacy Consult for heparin Indication: post embolization  Allergies  Allergen Reactions  . Penicillins Anaphylaxis, Shortness Of Breath and Other (See Comments)    Has patient had a PCN reaction causing immediate rash, facial/tongue/throat swelling, SOB or lightheadedness with hypotension: Yes Has patient had a PCN reaction causing severe rash involving mucus membranes or skin necrosis: No Has patient had a PCN reaction that required hospitalization Yes Has patient had a PCN reaction occurring within the last 10 years: No If all of the above answers are "NO", then may proceed with Cephalosporin use.     Patient Measurements: Height: '5\' 4"'$  (162.6 cm) Weight: 113 lb 8.6 oz (51.5 kg) IBW/kg (Calculated) : 54.7 Heparin Dosing Weight: 48.1kg  Vital Signs: Temp: 98.3 F (36.8 C) (05/09 0400) Temp Source: Oral (05/09 0400) BP: 140/63 mmHg (05/09 0445) Pulse Rate: 94 (05/09 0445)  Labs:  Recent Labs  03/16/16 0658 03/16/16 2047 03/17/16 0431  HGB 13.7  --  12.5  HCT 41.5  --  38.3  PLT 305  --  273  APTT 28  --   --   LABPROT 12.5  --   --   INR 0.91  --   --   HEPARINUNFRC  --  <0.10* <0.10*  CREATININE 0.52  --  0.39*    Estimated Creatinine Clearance: 60 mL/min (by C-G formula based on Cr of 0.39).   Assessment: 35 yof s/p cerebral angiogram initiated on IV heparin in PACU at 500 units/hr (~10units/kg/hr). Baseline CBC is WNL and she is not on anticoagulation PTA. Heparin level came back undetectable. No bleeding noted. CBC relatively stable. Plan to turn off at 0700.  Goal of Therapy:  Heparin level 0.1-0.25 units/ml Monitor platelets by anticoagulation protocol: Yes   Plan:  Increase heparin gtt to 650 units/hr *Heparin off at 0700 today  Sherlon Handing, PharmD, BCPS Clinical pharmacist, pager [phone] 03/17/2016 5:35 AM

## 2016-03-17 NOTE — Progress Notes (Signed)
Nurse attempted to titrate venturi mask down, patient began dropping her saturations again. No distress noted. RT in room, placed patient on high flow  with increase in O2 saturations. Will continue to monitor. Lianne Bushy, RN BSN

## 2016-03-17 NOTE — Care Management Note (Addendum)
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  PTA, pt independent of ADLS.                     Action/Plan: Pt denies any needs for home.  Daughter to provide care at dc.    Expected Discharge Date:  03/17/16               Expected Discharge Plan:  Home/Self Care  In-House Referral:     Discharge planning Services  CM Consult  Post Acute Care Choice:    Choice offered to:     DME Arranged:    DME Agency:     HH Arranged:    HH Agency:     Status of Service:  Completed, signed off  Medicare Important Message Given:    Date Medicare IM Given:    Medicare IM give by:    Date Additional Medicare IM Given:    Additional Medicare Important Message give by:     If discussed at [place] of Stay Meetings, dates discussed:    Additional Comments:  Reinaldo Raddle, RN, BSN  Trauma/Neuro ICU Case Manager [phone]

## 2016-03-19 ENCOUNTER — Other Ambulatory Visit (HOSPITAL_COMMUNITY): Payer: Self-pay | Admitting: Interventional Radiology

## 2016-03-19 DIAGNOSIS — I729 Aneurysm of unspecified site: Secondary | ICD-10-CM

## 2016-04-08 ENCOUNTER — Ambulatory Visit (HOSPITAL_COMMUNITY): Payer: BLUE CROSS/BLUE SHIELD

## 2016-04-14 ENCOUNTER — Ambulatory Visit (HOSPITAL_COMMUNITY): Payer: BLUE CROSS/BLUE SHIELD

## 2016-04-17 ENCOUNTER — Other Ambulatory Visit (HOSPITAL_COMMUNITY): Payer: Self-pay | Admitting: Interventional Radiology

## 2016-04-17 ENCOUNTER — Other Ambulatory Visit: Payer: Self-pay | Admitting: General Surgery

## 2016-04-17 ENCOUNTER — Ambulatory Visit (HOSPITAL_COMMUNITY)
Admission: RE | Admit: 2016-04-17 | Discharge: 2016-04-17 | Disposition: A | Discharge status: Home / Self Care | Payer: BLUE CROSS/BLUE SHIELD | Source: Ambulatory Visit | Attending: Interventional Radiology | Admitting: Interventional Radiology

## 2016-04-17 DIAGNOSIS — I729 Aneurysm of unspecified site: Secondary | ICD-10-CM | POA: Insufficient documentation

## 2016-04-17 LAB — PLATELET INHIBITION P2Y12: PLATELET FUNCTION P2Y12: 12 [PRU] — AB (ref 194–418)

## 2016-04-24 ENCOUNTER — Telehealth (HOSPITAL_COMMUNITY): Payer: Self-pay | Admitting: *Deleted

## 2016-04-24 ENCOUNTER — Inpatient Hospital Stay (HOSPITAL_COMMUNITY)
Admission: AD | Admit: 2016-04-24 | Discharge: 2016-04-30 | DRG: 853 | Disposition: A | Discharge status: Home / Self Care | Payer: BLUE CROSS/BLUE SHIELD | Source: Other Acute Inpatient Hospital | Attending: Internal Medicine | Admitting: Internal Medicine

## 2016-04-24 ENCOUNTER — Encounter (HOSPITAL_COMMUNITY): Payer: Self-pay | Admitting: General Practice

## 2016-04-24 DIAGNOSIS — R918 Other nonspecific abnormal finding of lung field: Secondary | ICD-10-CM | POA: Insufficient documentation

## 2016-04-24 DIAGNOSIS — Z9981 Dependence on supplemental oxygen: Secondary | ICD-10-CM | POA: Diagnosis not present

## 2016-04-24 DIAGNOSIS — J9601 Acute respiratory failure with hypoxia: Secondary | ICD-10-CM

## 2016-04-24 DIAGNOSIS — Z720 Tobacco use: Secondary | ICD-10-CM | POA: Diagnosis not present

## 2016-04-24 DIAGNOSIS — Z681 Body mass index (BMI) 19 or less, adult: Secondary | ICD-10-CM | POA: Diagnosis not present

## 2016-04-24 DIAGNOSIS — I739 Peripheral vascular disease, unspecified: Secondary | ICD-10-CM | POA: Diagnosis present

## 2016-04-24 DIAGNOSIS — F329 Major depressive disorder, single episode, unspecified: Secondary | ICD-10-CM | POA: Diagnosis present

## 2016-04-24 DIAGNOSIS — I639 Cerebral infarction, unspecified: Secondary | ICD-10-CM | POA: Diagnosis present

## 2016-04-24 DIAGNOSIS — F17200 Nicotine dependence, unspecified, uncomplicated: Secondary | ICD-10-CM | POA: Diagnosis present

## 2016-04-24 DIAGNOSIS — F1020 Alcohol dependence, uncomplicated: Secondary | ICD-10-CM | POA: Diagnosis not present

## 2016-04-24 DIAGNOSIS — J9811 Atelectasis: Secondary | ICD-10-CM | POA: Diagnosis not present

## 2016-04-24 DIAGNOSIS — Z9889 Other specified postprocedural states: Secondary | ICD-10-CM | POA: Diagnosis not present

## 2016-04-24 DIAGNOSIS — R591 Generalized enlarged lymph nodes: Secondary | ICD-10-CM | POA: Diagnosis present

## 2016-04-24 DIAGNOSIS — F1010 Alcohol abuse, uncomplicated: Secondary | ICD-10-CM | POA: Diagnosis present

## 2016-04-24 DIAGNOSIS — J449 Chronic obstructive pulmonary disease, unspecified: Secondary | ICD-10-CM | POA: Diagnosis present

## 2016-04-24 DIAGNOSIS — Z880 Allergy status to penicillin: Secondary | ICD-10-CM | POA: Diagnosis not present

## 2016-04-24 DIAGNOSIS — A419 Sepsis, unspecified organism: Principal | ICD-10-CM | POA: Diagnosis present

## 2016-04-24 DIAGNOSIS — Z79899 Other long term (current) drug therapy: Secondary | ICD-10-CM | POA: Diagnosis not present

## 2016-04-24 DIAGNOSIS — Z8673 Personal history of transient ischemic attack (TIA), and cerebral infarction without residual deficits: Secondary | ICD-10-CM

## 2016-04-24 DIAGNOSIS — J189 Pneumonia, unspecified organism: Secondary | ICD-10-CM

## 2016-04-24 DIAGNOSIS — E46 Unspecified protein-calorie malnutrition: Secondary | ICD-10-CM | POA: Diagnosis present

## 2016-04-24 DIAGNOSIS — I671 Cerebral aneurysm, nonruptured: Secondary | ICD-10-CM | POA: Diagnosis present

## 2016-04-24 DIAGNOSIS — E785 Hyperlipidemia, unspecified: Secondary | ICD-10-CM | POA: Diagnosis present

## 2016-04-24 DIAGNOSIS — E876 Hypokalemia: Secondary | ICD-10-CM | POA: Diagnosis present

## 2016-04-24 DIAGNOSIS — F32A Depression, unspecified: Secondary | ICD-10-CM | POA: Diagnosis present

## 2016-04-24 DIAGNOSIS — J984 Other disorders of lung: Secondary | ICD-10-CM | POA: Diagnosis not present

## 2016-04-24 DIAGNOSIS — Z7982 Long term (current) use of aspirin: Secondary | ICD-10-CM

## 2016-04-24 DIAGNOSIS — Z7902 Long term (current) use of antithrombotics/antiplatelets: Secondary | ICD-10-CM

## 2016-04-24 DIAGNOSIS — C3490 Malignant neoplasm of unspecified part of unspecified bronchus or lung: Secondary | ICD-10-CM | POA: Diagnosis not present

## 2016-04-24 DIAGNOSIS — R911 Solitary pulmonary nodule: Secondary | ICD-10-CM | POA: Insufficient documentation

## 2016-04-24 DIAGNOSIS — R042 Hemoptysis: Secondary | ICD-10-CM | POA: Diagnosis present

## 2016-04-24 DIAGNOSIS — E440 Moderate protein-calorie malnutrition: Secondary | ICD-10-CM | POA: Diagnosis present

## 2016-04-24 DIAGNOSIS — J690 Pneumonitis due to inhalation of food and vomit: Secondary | ICD-10-CM | POA: Diagnosis present

## 2016-04-24 DIAGNOSIS — R0902 Hypoxemia: Secondary | ICD-10-CM | POA: Diagnosis not present

## 2016-04-24 DIAGNOSIS — R0602 Shortness of breath: Secondary | ICD-10-CM | POA: Diagnosis present

## 2016-04-24 DIAGNOSIS — R590 Localized enlarged lymph nodes: Secondary | ICD-10-CM | POA: Diagnosis present

## 2016-04-24 DIAGNOSIS — C3432 Malignant neoplasm of lower lobe, left bronchus or lung: Secondary | ICD-10-CM | POA: Diagnosis present

## 2016-04-24 DIAGNOSIS — E871 Hypo-osmolality and hyponatremia: Secondary | ICD-10-CM | POA: Diagnosis present

## 2016-04-24 DIAGNOSIS — J42 Unspecified chronic bronchitis: Secondary | ICD-10-CM | POA: Diagnosis not present

## 2016-04-24 DIAGNOSIS — Z889 Allergy status to unspecified drugs, medicaments and biological substances status: Secondary | ICD-10-CM | POA: Insufficient documentation

## 2016-04-24 HISTORY — DX: Anxiety disorder, unspecified: F41.9

## 2016-04-24 HISTORY — DX: Other disorders of lung: J98.4

## 2016-04-24 HISTORY — DX: Pneumonia, unspecified organism: J18.9

## 2016-04-24 HISTORY — DX: Pneumonia, unspecified organism: J98.4

## 2016-04-24 LAB — BASIC METABOLIC PANEL
ANION GAP: 10 (ref 5–15)
CALCIUM: 8.6 mg/dL — AB (ref 8.9–10.3)
CHLORIDE: 96 mmol/L — AB (ref 101–111)
CO2: 24 mmol/L (ref 22–32)
CREATININE: 0.39 mg/dL — AB (ref 0.44–1.00)
GFR calc Af Amer: >60 mL/min (ref >60)
Glucose, Bld: 150 mg/dL — ABNORMAL HIGH (ref 65–99)
POTASSIUM: 3.2 mmol/L — AB (ref 3.5–5.1)
Sodium: 130 mmol/L — ABNORMAL LOW (ref 135–145)

## 2016-04-24 LAB — PROTIME-INR
INR: 0.98 (ref 0.00–1.49)
Prothrombin Time: 13.2 seconds (ref 11.6–15.2)

## 2016-04-24 LAB — PROCALCITONIN

## 2016-04-24 LAB — LACTIC ACID, PLASMA: LACTIC ACID, VENOUS: 1.3 mmol/L (ref 0.5–2.0)

## 2016-04-24 LAB — MAGNESIUM: Magnesium: 1.5 mg/dL — ABNORMAL LOW (ref 1.7–2.4)

## 2016-04-24 MED ORDER — VITAMIN D (ERGOCALCIFEROL) 1.25 MG (50000 UNIT) PO CAPS: 50000.0000 [IU] | ORAL_CAPSULE | ORAL | Status: DC

## 2016-04-24 MED ORDER — ATORVASTATIN CALCIUM 10 MG PO TABS
10.0000 mg | ORAL_TABLET | Freq: Every day | ORAL | Status: DC
  Administered 2016-04-24 – 2016-04-29 (×6): 10 mg via ORAL
  Filled 2016-04-24 (×7): qty 1

## 2016-04-24 MED ORDER — LORAZEPAM 2 MG/ML IJ SOLN
0.0000 mg | Freq: Two times a day (BID) | INTRAMUSCULAR | Status: AC
  Administered 2016-04-26 – 2016-04-27 (×3): 2 mg via INTRAVENOUS
  Filled 2016-04-24 (×3): qty 1

## 2016-04-24 MED ORDER — SODIUM CHLORIDE 0.9 % IV SOLN
500.0000 mg | Freq: Two times a day (BID) | INTRAVENOUS | Status: DC
  Administered 2016-04-24 – 2016-04-26 (×4): 500 mg via INTRAVENOUS
  Filled 2016-04-24 (×5): qty 500

## 2016-04-24 MED ORDER — CITALOPRAM HYDROBROMIDE 20 MG PO TABS: 20.0000 mg | ORAL_TABLET | Freq: Every day | ORAL | Status: DC

## 2016-04-24 MED ORDER — ADULT MULTIVITAMIN W/MINERALS CH
1.0000 | ORAL_TABLET | Freq: Every day | ORAL | Status: DC
  Administered 2016-04-24 – 2016-04-30 (×7): 1 via ORAL
  Filled 2016-04-24 (×7): qty 1

## 2016-04-24 MED ORDER — CLOPIDOGREL BISULFATE 75 MG PO TABS
75.0000 mg | ORAL_TABLET | Freq: Every day | ORAL | Status: DC
  Administered 2016-04-25 – 2016-04-30 (×6): 75 mg via ORAL
  Filled 2016-04-24 (×7): qty 1

## 2016-04-24 MED ORDER — METRONIDAZOLE IN NACL 5-0.79 MG/ML-% IV SOLN
500.0000 mg | Freq: Three times a day (TID) | INTRAVENOUS | Status: DC
  Administered 2016-04-24 – 2016-04-27 (×9): 500 mg via INTRAVENOUS
  Filled 2016-04-24 (×9): qty 100

## 2016-04-24 MED ORDER — LORAZEPAM 1 MG PO TABS: 1.0000 mg | ORAL_TABLET | Freq: Four times a day (QID) | ORAL | Status: AC | PRN

## 2016-04-24 MED ORDER — CITALOPRAM HYDROBROMIDE 40 MG PO TABS: 40.0000 mg | ORAL_TABLET | Freq: Every day | ORAL | Status: DC

## 2016-04-24 MED ORDER — ALBUTEROL SULFATE (2.5 MG/3ML) 0.083% IN NEBU
2.5000 mg | INHALATION_SOLUTION | RESPIRATORY_TRACT | Status: DC | PRN
  Administered 2016-04-26: 2.5 mg via RESPIRATORY_TRACT
  Filled 2016-04-24: qty 3

## 2016-04-24 MED ORDER — VITAMIN B-1 100 MG PO TABS
100.0000 mg | ORAL_TABLET | Freq: Every day | ORAL | Status: DC
  Administered 2016-04-24 – 2016-04-30 (×7): 100 mg via ORAL
  Filled 2016-04-24 (×7): qty 1

## 2016-04-24 MED ORDER — SODIUM CHLORIDE 0.9 % IV SOLN
INTRAVENOUS | Status: DC
  Administered 2016-04-24 – 2016-04-28 (×6): via INTRAVENOUS

## 2016-04-24 MED ORDER — CITALOPRAM HYDROBROMIDE 40 MG PO TABS
40.0000 mg | ORAL_TABLET | Freq: Every morning | ORAL | Status: DC
  Administered 2016-04-25 – 2016-04-30 (×6): 40 mg via ORAL
  Filled 2016-04-24 (×6): qty 1

## 2016-04-24 MED ORDER — LORAZEPAM 2 MG/ML IJ SOLN
0.0000 mg | Freq: Four times a day (QID) | INTRAMUSCULAR | Status: AC
Start: 2016-04-24 — End: 2016-04-26
  Administered 2016-04-24: 2 mg via INTRAVENOUS
  Filled 2016-04-24: qty 1

## 2016-04-24 MED ORDER — ASPIRIN EC 81 MG PO TBEC
81.0000 mg | DELAYED_RELEASE_TABLET | Freq: Every day | ORAL | Status: DC
  Administered 2016-04-25 – 2016-04-30 (×6): 81 mg via ORAL
  Filled 2016-04-24 (×7): qty 1

## 2016-04-24 MED ORDER — DEXTROSE 5 % IV SOLN
1.0000 g | Freq: Three times a day (TID) | INTRAVENOUS | Status: DC
  Administered 2016-04-24 – 2016-04-30 (×17): 1 g via INTRAVENOUS
  Filled 2016-04-24 (×20): qty 1

## 2016-04-24 MED ORDER — FOLIC ACID 1 MG PO TABS
1.0000 mg | ORAL_TABLET | Freq: Every day | ORAL | Status: DC
  Administered 2016-04-24 – 2016-04-30 (×7): 1 mg via ORAL
  Filled 2016-04-24 (×7): qty 1

## 2016-04-24 MED ORDER — ENSURE ENLIVE PO LIQD
237.0000 mL | Freq: Two times a day (BID) | ORAL | Status: DC
  Administered 2016-04-27 – 2016-04-28 (×2): 237 mL via ORAL

## 2016-04-24 MED ORDER — LORAZEPAM 2 MG/ML IJ SOLN: 1.0000 mg | Freq: Four times a day (QID) | INTRAMUSCULAR | Status: AC | PRN

## 2016-04-24 MED ORDER — ENOXAPARIN SODIUM 40 MG/0.4ML ~~LOC~~ SOLN
40.0000 mg | SUBCUTANEOUS | Status: DC
  Administered 2016-04-24 – 2016-04-29 (×6): 40 mg via SUBCUTANEOUS
  Filled 2016-04-24 (×6): qty 0.4

## 2016-04-24 MED ORDER — NICOTINE 21 MG/24HR TD PT24
21.0000 mg | MEDICATED_PATCH | Freq: Every day | TRANSDERMAL | Status: DC
  Filled 2016-04-24 (×4): qty 1

## 2016-04-24 MED ORDER — THIAMINE HCL 100 MG/ML IJ SOLN: 100.0000 mg | Freq: Every day | INTRAMUSCULAR | Status: DC

## 2016-04-24 MED ORDER — CITALOPRAM HYDROBROMIDE 20 MG PO TABS
20.0000 mg | ORAL_TABLET | Freq: Every day | ORAL | Status: DC
  Administered 2016-04-24 – 2016-04-29 (×6): 20 mg via ORAL
  Filled 2016-04-24 (×6): qty 1

## 2016-04-24 MED ORDER — DM-GUAIFENESIN ER 30-600 MG PO TB12
1.0000 | ORAL_TABLET | Freq: Two times a day (BID) | ORAL | Status: DC
  Administered 2016-04-24 – 2016-04-30 (×11): 1 via ORAL
  Filled 2016-04-24 (×11): qty 1

## 2016-04-24 NOTE — Progress Notes (Signed)
Received information regarding transfer from [facility]. 60 year old female with past medical history of tobacco abuse and COPD; who presented with shortness of breath. Found to have a large left lower lobe cavitary pneumonia. Started on vancomycin and aztreonam secondary to allergies. O2 requirements from 2 L to 4 L otherwise all other vital signs within normal limits. Transferring to a telemetry bed for possible need of bronchoscopy by  PCCM. PCCM  to be consulted prior to transport, but will need to verify that they are on the case.

## 2016-04-24 NOTE — Progress Notes (Addendum)
Patient trasfered from [facility]  to [phone] via EMS; alert and oriented x 4; no complaints of pain; IV saline locked in LAC. Orient patient to room and unit; gave patient care guide; instructed how to use the call bell and fall risk precautions. [place] Admission was notified. Tele box 05 was placed. Will continue to monitor the patient.

## 2016-04-24 NOTE — Progress Notes (Signed)
Pharmacy Antibiotic Note  60 year old female with history of COPD and stroke in 2/17 admitted to [facility] 6/15 by PCP with a chest x-ray showing a lung abnormality.  CT showed a lower lobe cavitary lesion/pneumonia.  Transferred to [name] 6/16 for further evaluation and possible bronchoscopy.  At [facility], she was receiving Vancomycin and Aztreonam for pneumonia  To be resumed here per pharmacy.  She was receiving Vancomycin 1 gram iv q 12 and Aztreonam 2 grams iv Q 8 at [place].  Plan: Decrease vancomycin to 500 mg iv Q 12 hours (goal trough = 15 to 20 mcg/dL) Aztreonam 1 gram iv Q 8 hours  Weight: 113 lb 8.6 oz (51.5 kg)  No data recorded.  No results for input(s): WBC, CREATININE, LATICACIDVEN, VANCOTROUGH, VANCOPEAK, VANCORANDOM, GENTTROUGH, GENTPEAK, GENTRANDOM, TOBRATROUGH, TOBRAPEAK, TOBRARND, AMIKACINPEAK, AMIKACINTROU, AMIKACIN in the last 168 hours.  CrCl cannot be calculated (Patient has no serum creatinine result on file.).    Allergies  Allergen Reactions  . Penicillins Anaphylaxis, Shortness Of Breath and Other (See Comments)    Has patient had a PCN reaction causing immediate rash, facial/tongue/throat swelling, SOB or lightheadedness with hypotension: Yes Has patient had a PCN reaction causing severe rash involving mucus membranes or skin necrosis: No Has patient had a PCN reaction that required hospitalization Yes Has patient had a PCN reaction occurring within the last 10 years: No If all of the above answers are "NO", then may proceed with Cephalosporin use.      Thank you for allowing pharmacy to be a part of this patient's care. Anette Guarneri, PharmD [phone] 04/24/2016 8:35 PM

## 2016-04-24 NOTE — H&P (Addendum)
History and Physical    Kylie Valencia:[id number] DOB: February 23, 1956 DOA: 04/24/2016  Referring MD/NP/PA:   PCP: Charletta Cousin, MD   Patient coming from:  The patient is coming from home.  At baseline, pt is independent for most of ADL.        Chief Complaint: Cough and shortness of breath  HPI: Kylie Valencia is a 60 y.o. female with medical history Valencia of alcohol abuse, tobacco abuse, hyperlipidemia, COPD, depression, PVD (s/p of Aorto-femoral bypass graft), stroke, brain aneurysm, who presents with cough and shortness of breath.  Pt is transferred from [facility]  Patient had a stroke, and admitted in February 2017. During that admission, she had left lower lobe pneumonia. Patient states that since then she has had multiple course of antibiotics for pneumonia. Most recently 2 weeks ago, patient was on course of doxycycline, and yesterday patient had gone to her PCP because of continuation of shortness of breath and cough. She was placed on Levaquin. PCP did X-ray, which was abnormal, and patient was advised go to [facility] for further evaluation and treatment. Patient had CT angiogram of chest in [facility], which showed left lower lobe cavitory lesion, with Valencia increase in the infiltration and adenopathy. Patient has subjective fever and chills intermittently, but no night sweating. Patient does not have any Valencia risk of tuberculosis, has not traveled outside of the [place]. Patient drinks alcohol every day. She has some cavities in her teeth.   Patient has cough with yellow colored sputum production, sometimes with pink or red sputum production. No chest pain. Patient had diarrhea recently, which has resolved. No diarrhea today. Patient does not have nausea, vomiting, symptoms of UTI or unilateral weakness.  ED Course: pt was found to have  WBC 15.3, temperature normal, potassium 2.8, which was treated with "4 bags of potassium" per  patient, sodium 132, creatinine normal. ABG showed pH of 7.48, PCO2 36, PO2 47. Pt is admitted to inpatient for further eval and treatment. PCCM was consulted for possible bronchoscopy.   Review of Systems:   General: has fevers, chills, no changes in body weight, has poor appetite, has fatigue HEENT: no blurry vision, hearing changes or sore throat Pulm: has dyspnea, coughing, no wheezing CV: no chest pain, no palpitations Abd: No nausea, vomiting, abdominal pain, diarrhea, constipation GU: no dysuria, burning on urination, increased urinary frequency, hematuria  Ext: no leg edema Neuro: no unilateral weakness, numbness, or tingling, no vision change or hearing loss Skin: no rash MSK: No muscle spasm, no deformity, no limitation of range of movement in spin Heme: No easy bruising.  Travel history: No recent long distant travel.  Allergy:  Allergies  Allergen Reactions  . Penicillins Anaphylaxis, Shortness Of Breath and Other (See Comments)    Has patient had a PCN reaction causing immediate rash, facial/tongue/throat swelling, SOB or lightheadedness with hypotension: Yes Has patient had a PCN reaction causing severe rash involving mucus membranes or skin necrosis: No Has patient had a PCN reaction that required hospitalization Yes Has patient had a PCN reaction occurring within the last 10 years: No If all of the above answers are "NO", then may proceed with Cephalosporin use.     Past Medical History  Diagnosis Date  . Hyperlipidemia     takes Lipitor nightly  . Depression     takes Citalopram nightly  . Peripheral vascular disease ([place]) 20+ yrs ago  . COPD (chronic obstructive pulmonary disease) ([place])  Combivent as needed  . Smokers' cough ([place])   . Pneumonia Feb 14,2017  . Cavitary pneumonia 04/24/2016  . Pneumonia <2017     "several times" (04/23/2016)  . Stroke ([place]) 12/2015    denies residual on 04/24/2016  . Anxiety     Past Surgical History  Procedure  Laterality Date  . Tubal ligation    . Aorto-femoral bypass graft    . Colonoscopy    . Radiology with anesthesia N/A 02/18/2016    Procedure: MRI OF BRAIN WITH AND WITHOUT CONTRAST, MRI OF CERVICAL SPINE;  Surgeon: Luanne Bras, MD;  Location: [facility];  Service: Radiology;  Laterality: N/A;  . Radiology with anesthesia N/A 03/16/2016    Procedure: EMBOLIZATION;  Surgeon: Luanne Bras, MD;  Location: [facility];  Service: Radiology;  Laterality: N/A;    Social History:  reports that she has been smoking.  She has never used smokeless tobacco. She reports that she drinks about 12.6 oz of alcohol per week. She reports that she does not use illicit drugs.  Family History:  Family History  Problem Relation Age of Onset  . Adopted: Yes     Prior to Admission medications   Medication Sig Start Date End Date Taking? Authorizing Provider  aspirin EC 325 MG tablet Take 325 mg by mouth daily.    Historical Provider, MD  aspirin EC 81 MG tablet Take 81 mg by mouth daily. Reported on 03/12/2016    Historical Provider, MD  atorvastatin (LIPITOR) 10 MG tablet Take 10 mg by mouth at bedtime. 12/15/15   Historical Provider, MD  citalopram (CELEXA) 20 MG tablet Take 20 mg by mouth every evening. Take 20 mg along with 40 mg to equal 60 mg nightly 12/15/15   Historical Provider, MD  citalopram (CELEXA) 40 MG tablet Take 40 mg by mouth every evening. Take 40 mg along with 20 mg to equal 60 mg nightly 12/15/15   Historical Provider, MD  clopidogrel (PLAVIX) 75 MG tablet Take 75 mg by mouth daily.    Historical Provider, MD  COMBIVENT RESPIMAT 20-100 MCG/ACT AERS respimat Inhale 1-2 puffs into the lungs daily as needed for wheezing.  02/01/16   Historical Provider, MD  Vitamin D, Ergocalciferol, (DRISDOL) 50000 units CAPS capsule Take 50,000 Units by mouth every 14 (fourteen) days. 12/15/15   Historical Provider, MD    Physical Exam: Filed Vitals:   04/24/16 2000  Weight: 51.5 kg (113 lb 8.6 oz)   General: Not in  acute distress HEENT:       Eyes: PERRL, EOMI, no scleral icterus.       ENT: No discharge from the ears and nose, no pharynx injection, no tonsillar enlargement.        Neck: No JVD, no bruit, no mass felt. Heme: No neck lymph node enlargement. Cardiac: S1/S2, RRR, No murmurs, No gallops or rubs. Pulm: has rales bilaterally. No wheezing, rhonchi or rubs. Abd: Soft, nondistended, nontender, no rebound pain, no organomegaly, BS present. GU: No hematuria Ext: No pitting leg edema bilaterally. 2+DP/PT pulse bilaterally. Musculoskeletal: No joint deformities, No joint redness or warmth, no limitation of ROM in spin. Skin: No rashes.  Neuro: Alert, oriented X3, cranial nerves II-XII grossly intact, moves all extremities normally. Psych: Patient is not psychotic, no suicidal or hemocidal ideation.  Labs on Admission: I have personally reviewed following labs and imaging studies  CBC: No results for input(s): WBC, NEUTROABS, HGB, HCT, MCV, PLT in the last 168 hours. Basic Metabolic Panel: No results  for input(s): NA, K, CL, CO2, GLUCOSE, BUN, CREATININE, CALCIUM, MG, PHOS in the last 168 hours. GFR: CrCl cannot be calculated (Patient has no serum creatinine result on file.). Liver Function Tests: No results for input(s): AST, ALT, ALKPHOS, BILITOT, PROT, ALBUMIN in the last 168 hours. No results for input(s): LIPASE, AMYLASE in the last 168 hours. No results for input(s): AMMONIA in the last 168 hours. Coagulation Profile: No results for input(s): INR, PROTIME in the last 168 hours. Cardiac Enzymes: No results for input(s): CKTOTAL, CKMB, CKMBINDEX, TROPONINI in the last 168 hours. BNP (last 3 results) No results for input(s): PROBNP in the last 8760 hours. HbA1C: No results for input(s): HGBA1C in the last 72 hours. CBG: No results for input(s): GLUCAP in the last 168 hours. Lipid Profile: No results for input(s): CHOL, HDL, LDLCALC, TRIG, CHOLHDL, LDLDIRECT in the last 72  hours. Thyroid Function Tests: No results for input(s): TSH, T4TOTAL, FREET4, T3FREE, THYROIDAB in the last 72 hours. Anemia Panel: No results for input(s): VITAMINB12, FOLATE, FERRITIN, TIBC, IRON, RETICCTPCT in the last 72 hours. Urine analysis: No results found for: COLORURINE, APPEARANCEUR, LABSPEC, PHURINE, GLUCOSEU, HGBUR, BILIRUBINUR, KETONESUR, PROTEINUR, UROBILINOGEN, NITRITE, LEUKOCYTESUR Sepsis Labs: '@LABRCNTIP'$ (procalcitonin:4,lacticidven:4) )No results found for this or any previous visit (from the past 240 hour(s)).   Radiological Exams on Admission: No results found.   EKG: Not done in ED, will get one.   Assessment/Plan Principal Problem:   Cavitary pneumonia Active Problems:   Brain aneurysm   Hyperlipidemia   Depression   Peripheral vascular disease (HCC)   COPD (chronic obstructive pulmonary disease) (HCC)   Stroke (HCC)   Tobacco abuse   Pneumonia   Cavitary pneumonia: As showed by CT angiogram. This is concerning for lung abscess. She has low risk of TB. ABG showed pH of 7.48, PCO2 36, PO2 47. PCCM was consulted.  - Will admit to Telemetry Bed as inp - IV Vancomycin and Aztreonam and Flagyl - Mucinex for cough  - prn Albuterol Nebs prn for SOB - Urine legionella and S. pneumococcal antigen - Follow up blood culture x2, sputum culture and respiratory virus panel - f/u PCCM recommendations  COPD (chronic obstructive pulmonary disease) ([place]): No signs of acute exacerbation. No wheezing or rhonchi on auscultation. -When necessary albuterol nebulizer  Sepsis: pt meets criteria for sepsis with leukocytosis, HR>90 and RR=20. Pending lactate level. Hemodynamically stable currently. -will get Procalcitonin and trend lactic acid levels per sepsis protocol. -IVF: NS 100 cc/h. If lacate is elevated, will start fluid resuscitation.  HLD: Last LDL was not on record. -Continue home medications: Lipitor  Depression: Stable, no suicidal or homicidal  ideations. -Continue home medications: Celexa  History of stroke: no acute new issues. -continue lipitor and ASA  Tobacco abuse and Alcohol abuse: -Did counseling about importance of quitting smoking -Nicotine patch -Did counseling about the importance of quitting drinking -CIWA protocol  Hypokalemia: K= 3.2 on admission. Mg=1.5 - Repleted KCl and give 2 g of magnesium sulfate  DVT ppx: SQ Lovenox Code Status: Full code Family Communication: None at bed side. Disposition Plan:  Anticipate discharge back to previous home environment Consults called:  PCCM Admission status: inp/ tele   Date of Service 04/24/2016    Ivor Costa [facility] Pager [phone]  If 7PM-7AM, please contact night-coverage [email or web address] Password [facility] 04/24/2016, 8:41 PM

## 2016-04-24 NOTE — H&P (Signed)
PULMONARY CONSULT   Name: Kylie Valencia MRN: [id number] DOB: 09/17/1956    ADMISSION DATE:  04/24/2016 CONSULTATION DATE:  6/16  REFERRING MD :    CHIEF COMPLAINT:  Cavitary PNA   BRIEF PATIENT DESCRIPTION:  60 year old female initially admitted to [facility] w/ CC: shortness of breath and cough. Has PMH sig for ETOH abuse, COPD, depression and most recently a CVA back in Feb 2017. Since d/c she has had multiple rounds of abx for recurrent PNAs. Most recently she had gone to see her PCP on 6/15 w/ cc cough and SOB. She was placed on Levaquin and CXR was obtained. This raised concern for cavitation in the LLL so she was referred to the ER for further eval. Further eval in the ER consisted of CT chest which showed : LLL lung lesion w/ associated lymphadenopathy. [facility].   ->ETOH daily -> dental caries  -> recent stroke  SIGNIFICANT EVENTS    STUDIES:  CT chest 6/15: cavitary LLL lung lesion w/ associated infiltrate. Increased hilar adenopathy   HISTORY OF PRESENT ILLNESS:   See above   PAST MEDICAL HISTORY :   has a past medical history of Hyperlipidemia; Depression; Peripheral vascular disease ([place]) (20+ yrs ago); COPD (chronic obstructive pulmonary disease) ([place]); Smokers' cough ([place]); Pneumonia (Feb 14,2017); Cavitary pneumonia (04/24/2016); Pneumonia (<2017 ); Stroke [facility]) (12/2015); and Anxiety.  has past surgical history that includes Tubal ligation; Aorto-femoral Bypass Graft; Colonoscopy; Radiology with anesthesia (N/A, 02/18/2016); Procedure(s):S/p LT ICA superior hypophyseal aneurysm embolization using the pipeline flow diverter device .   Prior to Admission medications   Medication Sig Start Date End Date Taking? Authorizing Provider  aspirin EC 325 MG tablet Take 325 mg by mouth daily.    Historical Provider, MD  aspirin EC 81 MG tablet Take 81 mg by mouth daily. Reported on 03/12/2016    Historical Provider, MD    atorvastatin (LIPITOR) 10 MG tablet Take 10 mg by mouth at bedtime. 12/15/15   Historical Provider, MD  citalopram (CELEXA) 20 MG tablet Take 20 mg by mouth every evening. Take 20 mg along with 40 mg to equal 60 mg nightly 12/15/15   Historical Provider, MD  citalopram (CELEXA) 40 MG tablet Take 40 mg by mouth every evening. Take 40 mg along with 20 mg to equal 60 mg nightly 12/15/15   Historical Provider, MD  clopidogrel (PLAVIX) 75 MG tablet Take 75 mg by mouth daily.    Historical Provider, MD  COMBIVENT RESPIMAT 20-100 MCG/ACT AERS respimat Inhale 1-2 puffs into the lungs daily as needed for wheezing.  02/01/16   Historical Provider, MD  Vitamin D, Ergocalciferol, (DRISDOL) 50000 units CAPS capsule Take 50,000 Units by mouth every 14 (fourteen) days. 12/15/15   Historical Provider, MD   Allergies  Allergen Reactions  . Penicillins Anaphylaxis, Shortness Of Breath and Other (See Comments)    Has patient had a PCN reaction causing immediate rash, facial/tongue/throat swelling, SOB or lightheadedness with hypotension: Yes Has patient had a PCN reaction causing severe rash involving mucus membranes or skin necrosis: No Has patient had a PCN reaction that required hospitalization Yes Has patient had a PCN reaction occurring within the last 10 years: No If all of the above answers are "NO", then may proceed with Cephalosporin use.     FAMILY HISTORY:  family history is not on file. She was adopted. SOCIAL HISTORY:  reports that she has been smoking.  She has  never used smokeless tobacco. She reports that she drinks about 12.6 oz of alcohol per week. She reports that she does not use illicit drugs.  REVIEW OF SYSTEMS:   Constitutional: + fever, chills, + weight loss, malaise, fatigue and diaphoresis.  HENT: Negative for hearing loss, ear pain, nosebleeds, congestion, sore throat, neck pain, tinnitus and ear discharge.  + dental caries.  Eyes: Negative for blurred vision, double vision, photophobia,  pain, discharge and redness.  Respiratory: +cough yellow sputum, no hemoptysis, + shortness of breath, wheezing and stridor.   Cardiovascular: Negative for chest pain, palpitations, orthopnea, claudication, leg swelling and PND.  Gastrointestinal: Negative for heartburn, nausea, vomiting, abdominal pain, diarrhea, constipation, blood in stool and melena.  Genitourinary: Negative for dysuria, urgency, frequency, hematuria and flank pain.  Musculoskeletal: Negative for myalgias, back pain, joint pain and falls.  Skin: Negative for itching and rash.  Neurological: Negative for dizziness, tingling, tremors, sensory change, speech change, focal weakness, seizures, loss of consciousness, weakness and headaches.  Endo/Heme/Allergies: Negative for environmental allergies and polydipsia. Does not bruise/bleed easily.  SUBJECTIVE:  Fells better  VITAL SIGNS: Temp:  [98.2 F (36.8 C)-99.5 F (37.5 C)] 98.3 F (36.8 C) (06/17 1304) Pulse Rate:  [85-91] 85 (06/17 1304) Resp:  [16-17] 16 (06/17 1304) BP: (119-130)/(67-78) 130/77 mmHg (06/17 1304) SpO2:  [93 %-95 %] 93 % (06/17 1304) Weight:  [113 lb 8.6 oz (51.5 kg)] 113 lb 8.6 oz (51.5 kg) (06/16 2000)  PHYSICAL EXAMINATION: General:  Awake, alert, no distress.  Neuro:  Awake, alert, no focal def  HEENT:  [place], has partials upper and lower. Does have some chronic caries  Cardiovascular:  RRR w/out MRG Lungs:  Diffuse rhonchi. + bowel sounds  Abdomen:  Soft, not tender + bowel sounds  Musculoskeletal:  Equal st and bulk  Skin:  Warm and dry    Recent Labs Lab 04/24/16 2036 04/25/16 1216  NA 130* 133*  K 3.2* 3.4*  CL 96* 101  CO2 24 26  BUN <5* 5*  CREATININE 0.39* 0.43*  GLUCOSE 150* 98    Recent Labs Lab 04/25/16 1216  HGB 11.2*  HCT 33.6*  WBC 18.0*  PLT 282   No results found.  ASSESSMENT / PLAN:  Cavitary lung lesion (left lower lobe). Primary d-dx includes cancer vs cancer w/ post-obt PNA w/ cavitation vs chronic  aspiration.  Present since April 2017, increased in size since that point.   Plan Cont current abx Will try to arrange for FOB on Monday Keep NPO starting mid-night Sunday  After FOB would consider swallow eval and orthopantogram   Pulmonary and [place] Pager: [phone]  04/25/2016, 4:48 PM  Attending Note:  60 year old female with known cavitation noted on CT on February of this year who presents with a pneumonia and multiple pulmonary nodules. On exam, crackles bibasilarly and rhonchi L>R. I reviewed chest CT myself, cavitation with debris noted, surrounding infiltrate and multiple pulmonary nodules. Discussed with PCCM-NP.  Pulmonary nodules/?mass: differential very wide. - Bronch on Monday. - Doubt will be able to biopsy. - Inspect left airway.  Cavitary pneumonia/lesion: etoh abuse so likely progressive aspiration and adding a stroke makes chronic aspiration very likely with cavitary pneumonia but can not r/o post obstructive with smoking history. - Flagyl/aztreonam/vanc. - F/U on cultures. - Would call ID to make recommendations on long term abx. - Plan on bronch on Monday.  Hypoxemia: - Titrate O2 for sat of 88-92%. - Ambulatory desat prior  to discharge for home O2.  COPD: - Bronchodilators as ordered. - Will need PFT's once fully treated for PNA.  Patient seen and examined, agree with above note. I dictated the care and orders written for this patient under my direction.  Rush Farmer, MD [phone]

## 2016-04-24 NOTE — Telephone Encounter (Signed)
LM for patient to call me back in regards to medication change.

## 2016-04-25 DIAGNOSIS — J189 Pneumonia, unspecified organism: Secondary | ICD-10-CM

## 2016-04-25 DIAGNOSIS — R0902 Hypoxemia: Secondary | ICD-10-CM

## 2016-04-25 DIAGNOSIS — R911 Solitary pulmonary nodule: Secondary | ICD-10-CM | POA: Insufficient documentation

## 2016-04-25 DIAGNOSIS — J984 Other disorders of lung: Secondary | ICD-10-CM

## 2016-04-25 DIAGNOSIS — R918 Other nonspecific abnormal finding of lung field: Secondary | ICD-10-CM | POA: Insufficient documentation

## 2016-04-25 LAB — CBC WITH DIFFERENTIAL/PLATELET
BASOS ABS: 0 10*3/uL (ref 0.0–0.1)
BASOS PCT: 0 %
EOS ABS: 0.7 10*3/uL (ref 0.0–0.7)
EOS PCT: 4 %
HCT: 33.6 % — ABNORMAL LOW (ref 36.0–46.0)
Hemoglobin: 11.2 g/dL — ABNORMAL LOW (ref 12.0–15.0)
LYMPHS PCT: 5 %
Lymphs Abs: 1 10*3/uL (ref 0.7–4.0)
MCH: 32.2 pg (ref 26.0–34.0)
MCHC: 33.3 g/dL (ref 30.0–36.0)
MCV: 96.6 fL (ref 78.0–100.0)
MONO ABS: 1.6 10*3/uL — AB (ref 0.1–1.0)
Monocytes Relative: 9 %
Neutro Abs: 14.7 10*3/uL — ABNORMAL HIGH (ref 1.7–7.7)
Neutrophils Relative %: 82 %
PLATELETS: 282 10*3/uL (ref 150–400)
RBC: 3.48 MIL/uL — AB (ref 3.87–5.11)
RDW: 13.3 % (ref 11.5–15.5)
WBC: 18 10*3/uL — AB (ref 4.0–10.5)

## 2016-04-25 LAB — BASIC METABOLIC PANEL
Anion gap: 6 (ref 5–15)
BUN: 5 mg/dL — AB (ref 6–20)
CALCIUM: 8.4 mg/dL — AB (ref 8.9–10.3)
CO2: 26 mmol/L (ref 22–32)
Chloride: 101 mmol/L (ref 101–111)
Creatinine, Ser: 0.43 mg/dL — ABNORMAL LOW (ref 0.44–1.00)
GFR calc Af Amer: >60 mL/min (ref >60)
GLUCOSE: 98 mg/dL (ref 65–99)
POTASSIUM: 3.4 mmol/L — AB (ref 3.5–5.1)
SODIUM: 133 mmol/L — AB (ref 135–145)

## 2016-04-25 LAB — C DIFFICILE QUICK SCREEN W PCR REFLEX
C DIFFICILE (CDIFF) INTERP: NEGATIVE
C DIFFICILE (CDIFF) TOXIN: NEGATIVE
C Diff antigen: NEGATIVE

## 2016-04-25 LAB — MAGNESIUM: MAGNESIUM: 2 mg/dL (ref 1.7–2.4)

## 2016-04-25 LAB — LACTIC ACID, PLASMA: LACTIC ACID, VENOUS: 1 mmol/L (ref 0.5–2.0)

## 2016-04-25 LAB — BRAIN NATRIURETIC PEPTIDE: B NATRIURETIC PEPTIDE 5: 129.1 pg/mL — AB (ref 0.0–100.0)

## 2016-04-25 MED ORDER — MAGNESIUM SULFATE 2 GM/50ML IV SOLN
2.0000 g | Freq: Once | INTRAVENOUS | Status: AC
  Administered 2016-04-25: 2 g via INTRAVENOUS
  Filled 2016-04-25: qty 50

## 2016-04-25 MED ORDER — POTASSIUM CHLORIDE 20 MEQ/15ML (10%) PO SOLN
30.0000 meq | Freq: Once | ORAL | Status: AC
  Administered 2016-04-25: 30 meq via ORAL
  Filled 2016-04-25: qty 30

## 2016-04-25 NOTE — Progress Notes (Signed)
[facility] PROGRESS NOTE  Kylie Valencia WEX:[id number] DOB: 11-10-1955 DOA: 04/24/2016 PCP: Charletta Cousin, MD  Assessment/Plan: Cavitary pneumonia: As showed by CT angiogram. This is concerning for lung abscess. She has low risk of TB. ABG showed pH of 7.48, PCO2 36, PO2 47. PCCM was consulted.  - Cont Telemetry Bed as inp - IV Vancomycin and Aztreonam and Flagyl, cont - Mucinex for cough  - prn Albuterol Nebs prn for SOB - Urine legionella and S. pneumococcal antigen - Follow up blood culture x2, sputum culture and respiratory virus panel - f/u PCCM recommendations Antibiotics are very likely causing loose stools but will check C. difficile  COPD (chronic obstructive pulmonary disease) ([place]): No signs of acute exacerbation. No wheezing or rhonchi on auscultation. -When necessary albuterol nebulizer  Sepsis: pt meets criteria for sepsis with leukocytosis, HR>90 and RR=20. Pending lactate level. Hemodynamically stable currently. -will get Procalcitonin and trend lactic acid levels per sepsis protocol, neg -IVF: NS 100 cc/h. If lacate is elevated, will start fluid resuscitation. Cont while NPO Some hypovolemic hyponatremia  HLD: Last LDL was not on record. -Continue home medications: Lipitor  Depression: Stable, no suicidal or homicidal ideations. -Continue home medications: Celexa  History of stroke: no acute new issues. -continue lipitor and ASA  Tobacco abuse and Alcohol abuse: -Did counseling about importance of quitting smoking -Nicotine patch -Did counseling about the importance of quitting drinking -CIWA protocol  Hypokalemia: K= 3.2 on admission. Mg=1.5 - Repleted KCl and give 2 g of magnesium sulfate - will recheck  DVT ppx: SQ Lovenox Code Status: Full code Family Communication: None at bed side. Disposition Plan: Anticipate discharge back to previous home environment Consults called: PCCM Admission status: inp/ tele     Antibiotics:  Vanc,  flagyl, erta (indicate start date, and stop date if known)  HPI/Subjective: Patient states she feels well. Having some loose stools. Patient states she's had loose stools since she was first started on antibiotic.  Patient denying headache nausea vomiting. Patient states she does not have more short of breath.  Objective: Filed Vitals:   04/25/16 0004 04/25/16 0619  BP: 126/67 119/78  Pulse: 88 91  Temp: 98.2 F (36.8 C) 99.5 F (37.5 C)  Resp: 16 17    Intake/Output Summary (Last 24 hours) at 04/25/16 1200 Last data filed at 04/25/16 0844  Gross per 24 hour  Intake   1860 ml  Output    900 ml  Net    960 ml   Filed Weights   04/24/16 2000  Weight: 51.5 kg (113 lb 8.6 oz)    Exam:  General:  No diaphoresis, anxious, no acute distress Cardiovascular: Regular rate and rhythm no murmurs rubs or gallops Respiratory: Clear to auscultation bilaterally & incr wob Abdomen: Nondistended bowel sounds normal nontender palpation Musculoskeletal: Moving all extremities, no deformity, 5 out of 5 strength    Data Reviewed: Basic Metabolic Panel:  Recent Labs Lab 04/24/16 2036  NA 130*  K 3.2*  CL 96*  CO2 24  GLUCOSE 150*  BUN <5*  CREATININE 0.39*  CALCIUM 8.6*  MG 1.5*   Liver Function Tests: No results for input(s): AST, ALT, ALKPHOS, BILITOT, PROT, ALBUMIN in the last 168 hours. No results for input(s): LIPASE, AMYLASE in the last 168 hours. No results for input(s): AMMONIA in the last 168 hours. CBC: No results for input(s): WBC, NEUTROABS, HGB, HCT, MCV, PLT in the last 168 hours. Cardiac Enzymes: No results for input(s): CKTOTAL, CKMB, CKMBINDEX, TROPONINI in  the last 168 hours. BNP (last 3 results)  Recent Labs  04/25/16 0500  BNP 129.1*    ProBNP (last 3 results) No results for input(s): PROBNP in the last 8760 hours.  CBG: No results for input(s): GLUCAP in the last 168 hours.  No results found for this or any previous visit (from the past 240  hour(s)).   Studies: No results found.  Scheduled Meds: . aspirin EC  81 mg Oral Daily  . atorvastatin  10 mg Oral QHS  . aztreonam  1 g Intravenous Q8H  . citalopram  20 mg Oral QHS  . citalopram  40 mg Oral q morning - 10a  . clopidogrel  75 mg Oral Daily  . dextromethorphan-guaiFENesin  1 tablet Oral BID  . enoxaparin (LOVENOX) injection  40 mg Subcutaneous Q24H  . feeding supplement (ENSURE ENLIVE)  237 mL Oral BID BM  . folic acid  1 mg Oral Daily  . LORazepam  0-4 mg Intravenous Q6H   Followed by  . [START ON 04/26/2016] LORazepam  0-4 mg Intravenous Q12H  . metronidazole  500 mg Intravenous Q8H  . multivitamin with minerals  1 tablet Oral Daily  . nicotine  21 mg Transdermal Daily  . thiamine  100 mg Oral Daily   Or  . thiamine  100 mg Intravenous Daily  . vancomycin  500 mg Intravenous Q12H   Continuous Infusions: . sodium chloride 100 mL/hr at 04/24/16 2156    Principal Problem:   Cavitary pneumonia Active Problems:   Brain aneurysm   Hyperlipidemia   Depression   Peripheral vascular disease ([facility])   COPD (chronic obstructive pulmonary disease) ([place])   Stroke ([place])   Tobacco abuse   Pneumonia   Protein-calorie malnutrition-moderate    Time spent: [facility] Pager [phone]. If 7PM-7AM, please contact night-coverage at [email or web address], password [facility] 04/25/2016, 12:00 PM  LOS: 1 day

## 2016-04-25 NOTE — Progress Notes (Signed)
Initial Nutrition Assessment  DOCUMENTATION CODES:  Not applicable  INTERVENTION:  Pt declined supplements. Monitor meal intake and reevaluate need for snacks, supplements, vitamins, education as warranted.   MVI with minerals  NUTRITION DIAGNOSIS:  Increased nutrient needs related to acute illness as evidenced by estimated nutritional requirements for the condition  GOAL:  Patient will meet greater than or equal to 90% of their needs  MONITOR:  PO intake, Diet advancement, Labs  REASON FOR ASSESSMENT:  Malnutrition Screening Tool    ASSESSMENT:  60 y/o female PMHx Tobacco/alcohol abuse, HLD, COPD, Depression, PVD, CVA. Presented with cough and SOB. CT showed left lower lobe cavitory lesion w/ significant infiltration and adenopathy. Admitted for cavitary PNA.   Pt gave contradicting statement about how well she was eating. Initially, she reported she has been eating less than usual for the last two weeks due to a poor appetite. However, later states she is eating very well in general and today she says she is very hungry. She reports diarrhea for the last 2 weeks as well. She attributes this mainly to her ABx she has been on.   Her dietary habits are as follows. She eats 3 meals a day, her meals mainly consist of vegetables, she doesn't follow any type of therapeutic diet, she does not take any extra vitamins minerals.   She believes the recent weight is inaccurate. When told she is measured at 113 lbs she says "I wish". Bed weight shows 110 lbs. She says this is her UBW. Per care everywhere chart review, she was weighed at an osh at 116 lbs 52.9 kg on 09/13/15. This is a 5 % weight loss in ~ 7 months  When offered supplements to promote weight gain she declined stating "I eat actual food". Emphasized importance of protein and offered other options, still declined.   She is NPO at the moment. Currently being supplemented with Folate/Thiamin  NFPE: Deferred  WBC:10.8  Recent  Labs Lab 04/24/16 2036  NA 130*  K 3.2*  CL 96*  CO2 24  BUN <5*  CREATININE 0.39*  CALCIUM 8.6*  MG 1.5*  GLUCOSE 150*   Diet Order:  Diet NPO time specified Except for: Ice Chips, Sips with Meds  Skin:  Reviewed, no issues  Last BM:  6/15  Height:  Ht Readings from Last 1 Encounters:  03/16/16 '5\' 4"'$  (1.626 m)   Weight:  Wt Readings from Last 1 Encounters:  04/24/16 113 lb 8.6 oz (51.5 kg)   Wt Readings from Last 10 Encounters:  04/24/16 113 lb 8.6 oz (51.5 kg)  03/16/16 113 lb 8.6 oz (51.5 kg)  02/18/16 106 lb (48.081 kg)  02/14/16 106 lb 6.4 oz (48.263 kg)  01/17/16 110 lb (49.896 kg)  Bed weight today was 110 lbs.   Ideal Body Weight:  54.54 kg  BMI:  Body mass index is 19.48 kg/(m^2).  Estimated Nutritional Needs:  Kcal:  1600-1800 (32-36 kcal/kg bw) Protein:  65-75 g (1.3-1.5 g/kg bw) Fluid:  >1800 mls fluid  EDUCATION NEEDS:  No education needs identified at this time  Burtis Junes RD, LDN, [place] Pager: [phone] 04/25/2016 10:13 AM

## 2016-04-25 NOTE — Progress Notes (Signed)
SATURATION QUALIFICATIONS: (This note is used to comply with regulatory documentation for home oxygen)  Patient Saturations on Room Air at Rest = 90%  Patient Saturations on Room Air while Ambulating = 86-89%  Patient Saturations on 2 Liters of oxygen while Ambulating = 90-93%  Please briefly explain why patient needs home oxygen:Pt desats when O2 not in place.  May need home O2.  Thanks.  [place] [phone] (pager)

## 2016-04-25 NOTE — Evaluation (Signed)
Physical Therapy Evaluation Patient Details Name: Kylie Valencia MRN: [id number] DOB: 04-01-1956 Today's Date: 04/25/2016   History of Present Illness  60 year old female with history of COPD and stroke in 2/17 admitted to [facility] 6/15 by PCP with a chest x-ray showing a lung abnormality. CT showed a lower lobe cavitary lesion/pneumonia. Transferred to [name] 6/16 for further evaluation and possible bronchoscopy. At [facility], she was receiving Vancomycin and Aztreonam for pneumonia   Clinical Impression  Pt admitted with above diagnosis. Pt currently with functional limitations due to the deficits listed below (see PT Problem List). Pt was able to ambulate with 2LO2 and keep sats >90% with good safety without device. No LOB with pt reporting slight weakness in LEs.  Will follow acutely to ensure mobility.  Encouraged pt to call nurse to walk 2 x day.   Pt will benefit from skilled PT to increase their independence and safety with mobility to allow discharge to the venue listed below.     Follow Up Recommendations Home health PT;Supervision/Assistance - 24 hour    Equipment Recommendations  Other (comment) (Possibly home O2)    Recommendations for Other Services       Precautions / Restrictions Precautions Precautions: Fall Precaution Comments:  DROPLET and cdiff Restrictions Weight Bearing Restrictions: No      Mobility  Bed Mobility Overal bed mobility: Independent                Transfers Overall transfer level: Independent                  Ambulation/Gait Ambulation/Gait assistance: Supervision Ambulation Distance (Feet): 350 Feet Assistive device: None Gait Pattern/deviations: Step-through pattern;Decreased stride length   Gait velocity interpretation: Below normal speed for age/gender General Gait Details: Pt was able to ambulate without device with good overall stability.  Pt states she feels weaker than her baseline.    Stairs             Wheelchair Mobility    Modified Rankin (Stroke Patients Only)       Balance Overall balance assessment: Needs assistance Sitting-balance support: No upper extremity supported;Feet supported Sitting balance-Leahy Scale: Good     Standing balance support: No upper extremity supported;During functional activity Standing balance-Leahy Scale: Fair Standing balance comment: can stand statically without device with min guard assist.              High level balance activites: Direction changes;Turns;Sudden stops;Head turns High Level Balance Comments: supervision for all of above.             Pertinent Vitals/Pain Pain Assessment: No/denies pain  Needed 2LO2 to keep sat >90%.      Home Living Family/patient expects to be discharged to:: Private residence Living Arrangements: Non-relatives/Friends Available Help at Discharge: Friend(s);Available PRN/intermittently (roommate lives with pt and works fulltime) Type of Home: House Home Access: Stairs to enter Entrance Stairs-Rails: None Technical brewer of Steps: [place]: One level Home Equipment: None Additional Comments: pt worked fulltime in Therapist, art P[employer]    Prior Function Level of Independence: Independent               Journalist, newspaper        Extremity/Trunk Assessment   Upper Extremity Assessment: Defer to OT evaluation           Lower Extremity Assessment: Generalized weakness      Cervical / Trunk Assessment: Normal  Communication   Communication: No difficulties  Cognition Arousal/Alertness: Awake/alert  Behavior During Therapy: WFL for tasks assessed/performed Overall Cognitive Status: Within Functional Limits for tasks assessed                      General Comments      Exercises        Assessment/Plan    PT Assessment Patient needs continued PT services  PT Diagnosis Generalized weakness   PT Problem List Decreased activity tolerance;Decreased  balance;Decreased mobility;Decreased knowledge of use of DME;Decreased safety awareness;Decreased knowledge of precautions  PT Treatment Interventions Gait training;DME instruction;Functional mobility training;Therapeutic activities;Therapeutic exercise;Stair training;Balance training;Patient/family education   PT Goals (Current goals can be found in the Care Plan section) Acute Rehab PT Goals Patient Stated Goal: to go home PT Goal Formulation: With patient Time For Goal Achievement: 05/09/16 Potential to Achieve Goals: Good    Frequency Min 3X/week   Barriers to discharge        Co-evaluation               End of Session Equipment Utilized During Treatment: Gait belt;Oxygen Activity Tolerance: Patient limited by fatigue Patient left: in bed;with call bell/phone within reach;with bed alarm set;with family/visitor present Nurse Communication: Mobility status         Time: 1281-1886 PT Time Calculation (min) (ACUTE ONLY): 24 min   Charges:   PT Evaluation $PT Eval Moderate Complexity: 1 Procedure PT Treatments $Gait Training: 8-22 mins   PT G CodesDenice Paradise May 18, 2016, 2:35 PM Lakin Rhine,PT Acute Rehabilitation [phone] (pager)

## 2016-04-26 DIAGNOSIS — J449 Chronic obstructive pulmonary disease, unspecified: Secondary | ICD-10-CM

## 2016-04-26 LAB — CBC WITH DIFFERENTIAL/PLATELET
Basophils Absolute: 0 10*3/uL (ref 0.0–0.1)
Basophils Relative: 0 %
EOS ABS: 1.6 10*3/uL — AB (ref 0.0–0.7)
EOS PCT: 12 %
HCT: 34.3 % — ABNORMAL LOW (ref 36.0–46.0)
HEMOGLOBIN: 11.2 g/dL — AB (ref 12.0–15.0)
LYMPHS ABS: 1.2 10*3/uL (ref 0.7–4.0)
Lymphocytes Relative: 9 %
MCH: 31.7 pg (ref 26.0–34.0)
MCHC: 32.7 g/dL (ref 30.0–36.0)
MCV: 97.2 fL (ref 78.0–100.0)
MONOS PCT: 8 %
Monocytes Absolute: 1.1 10*3/uL — ABNORMAL HIGH (ref 0.1–1.0)
Neutro Abs: 10 10*3/uL — ABNORMAL HIGH (ref 1.7–7.7)
Neutrophils Relative %: 71 %
PLATELETS: 303 10*3/uL (ref 150–400)
RBC: 3.53 MIL/uL — ABNORMAL LOW (ref 3.87–5.11)
RDW: 13.5 % (ref 11.5–15.5)
WBC: 13.9 10*3/uL — ABNORMAL HIGH (ref 4.0–10.5)

## 2016-04-26 LAB — BASIC METABOLIC PANEL
Anion gap: 8 (ref 5–15)
BUN: 5 mg/dL — AB (ref 6–20)
CHLORIDE: 101 mmol/L (ref 101–111)
CO2: 24 mmol/L (ref 22–32)
CREATININE: 0.38 mg/dL — AB (ref 0.44–1.00)
Calcium: 8 mg/dL — ABNORMAL LOW (ref 8.9–10.3)
GFR calc Af Amer: >60 mL/min (ref >60)
GFR calc non Af Amer: >60 mL/min (ref >60)
Glucose, Bld: 86 mg/dL (ref 65–99)
Potassium: 3.1 mmol/L — ABNORMAL LOW (ref 3.5–5.1)
Sodium: 133 mmol/L — ABNORMAL LOW (ref 135–145)

## 2016-04-26 LAB — PHOSPHORUS: Phosphorus: 2.7 mg/dL (ref 2.5–4.6)

## 2016-04-26 LAB — MAGNESIUM: Magnesium: 1.7 mg/dL (ref 1.7–2.4)

## 2016-04-26 LAB — VANCOMYCIN, TROUGH: VANCOMYCIN TR: 7 ug/mL — AB (ref 10.0–20.0)

## 2016-04-26 MED ORDER — POTASSIUM CHLORIDE CRYS ER 20 MEQ PO TBCR
30.0000 meq | EXTENDED_RELEASE_TABLET | ORAL | Status: AC
  Administered 2016-04-26 (×3): 30 meq via ORAL
  Filled 2016-04-26 (×3): qty 1

## 2016-04-26 MED ORDER — VANCOMYCIN HCL IN DEXTROSE 1-5 GM/200ML-% IV SOLN
1000.0000 mg | Freq: Two times a day (BID) | INTRAVENOUS | Status: DC
  Administered 2016-04-26 – 2016-04-29 (×7): 1000 mg via INTRAVENOUS
  Filled 2016-04-26 (×8): qty 200

## 2016-04-26 MED ORDER — MAGNESIUM SULFATE IN D5W 1-5 GM/100ML-% IV SOLN
1.0000 g | Freq: Once | INTRAVENOUS | Status: AC
  Administered 2016-04-26: 1 g via INTRAVENOUS
  Filled 2016-04-26: qty 100

## 2016-04-26 NOTE — Evaluation (Signed)
Occupational Therapy Evaluation Patient Details Name: Kylie Valencia MRN: [id number] DOB: 11-20-1955 Today's Date: 04/26/2016    History of Present Illness 60 year old female with history of COPD and stroke in 2/17 admitted to [facility] 6/15 by PCP with a chest x-ray showing a lung abnormality. CT showed a lower lobe cavitary lesion/pneumonia. Transferred to [name] 6/16 for further evaluation and possible bronchoscopy. At [facility], she was receiving Vancomycin and Aztreonam for pneumonia    Clinical Impression   Patient evaluated by Occupational Therapy with no further acute OT needs identified. All education has been completed and the patient has no further questions. Pt is able to perform ADLs mod I, and functional mobility independently.  02 sats 88% on RA with activity, but quickly increased to low 90s with pursed lip breathing.    See below for any follow-up Occupational Therapy or equipment needs. OT is signing off. Thank you for this referral.      Follow Up Recommendations  No OT follow up;Supervision - Intermittent    Equipment Recommendations  None recommended by OT    Recommendations for Other Services       Precautions / Restrictions Precautions Precautions: None      Mobility Bed Mobility                  Transfers Overall transfer level: Independent                    Balance Overall balance assessment: No apparent balance deficits (not formally assessed)                                          ADL Overall ADL's : Modified independent                                       General ADL Comments: Pt is able to perform ADLs with sats dropping to 88% on RA, but rebounds quickly to 90% with pursed lip breathing      Vision     Perception     Praxis      Pertinent Vitals/Pain Pain Assessment: No/denies pain     Hand Dominance Right   Extremity/Trunk Assessment Upper Extremity  Assessment Upper Extremity Assessment: Overall WFL for tasks assessed   Lower Extremity Assessment Lower Extremity Assessment: Defer to PT evaluation   Cervical / Trunk Assessment Cervical / Trunk Assessment: Normal   Communication Communication Communication: No difficulties   Cognition Arousal/Alertness: Awake/alert Behavior During Therapy: WFL for tasks assessed/performed Overall Cognitive Status: Within Functional Limits for tasks assessed                     General Comments       Exercises       Shoulder Instructions      Home Living Family/patient expects to be discharged to:: Private residence Living Arrangements: Non-relatives/Friends Available Help at Discharge: Friend(s);Available PRN/intermittently (roommate lives with pt and works fulltime) Type of Home: House Home Access: Stairs to enter Technical brewer of Steps: 4 Entrance Stairs-Rails: None Home Layout: One level     Bathroom Shower/Tub: Technical sales engineer: Yes   Home Equipment: None   Additional Comments: pt worked fulltime in Therapist, art PTA  Prior Functioning/Environment Level of Independence: Independent        Comments: Pt is very active     OT Diagnosis: Generalized weakness   OT Problem List:     OT Treatment/Interventions:      OT Goals(Current goals can be found in the care plan section) Acute Rehab OT Goals Patient Stated Goal: to go home OT Goal Formulation: All assessment and education complete, DC therapy  OT Frequency:     Barriers to D/C:            Co-evaluation              End of Session Nurse Communication: Mobility status  Activity Tolerance: Patient tolerated treatment well Patient left: in chair;with call bell/phone within reach   Time: 1056-1110 OT Time Calculation (min): 14 min Charges:  OT General Charges $OT Visit: 1 Procedure OT Evaluation $OT Eval Low Complexity: 1 Procedure G-Codes:     Berklee Battey M 2016-05-13, 11:14 AM

## 2016-04-26 NOTE — Progress Notes (Signed)
PROGRESS NOTE                                                                                                                                                                                                             Patient Demographics:    Kylie Valencia, is a 60 y.o. female, DOB - Sep 16, 1956, RCV:[id number]  Admit date - 04/24/2016   Admitting Physician Ivor Costa, MD  Outpatient Primary MD for the patient is Charletta Cousin, MD  LOS - 2   No chief complaint on file.      Brief Narrative   60 year old femaleWith past medical history of COPD, alcohol abuse, depression, CVA,  admitted initially trend of hospital for dyspnea and cough, she had multiple rounds of antibiotics for recurrent pneumonia treatment, CT chest significant for left lower lung lesion with associated lymphadenopathy especially is for cavitary pneumonia, admitted for further management, and by [facility] a.m., plan for bronchoscopy in a.m.   Subjective:    Kylie Valencia today has, No headache, No chest pain, No abdominal pain , Complains of cough   Assessment  & Plan :    Principal Problem:   Cavitary pneumonia Active Problems:   Brain aneurysm   Hyperlipidemia   Depression   Peripheral vascular disease (HCC)   COPD (chronic obstructive pulmonary disease) (HCC)   Stroke (HCC)   Tobacco abuse   Pneumonia   Protein-calorie malnutrition-moderate   Solitary pulmonary nodule   Lung mass   Hypoxemia  Cavitary pneumonia: As showed by CT angiogram. This is concerning for lung abscess. She has low risk of TB. ABG showed pH of 7.48, PCO2 36, PO2 47. PCCM was consulted.  - Cont Telemetry Bed as inp - IV Vancomycin and Aztreonam and Flagyl, cont, ID consulted regarding long-term enteric management - Mucinex for cough  - prn Albuterol Nebs prn for SOB - Urine legionella and S. pneumococcal antigen - Follow up blood culture x2, sputum culture and respiratory virus panel - Seen by  PCCM., and plan for bronchoscopy in a.m.  COPD (chronic obstructive pulmonary disease) (HCC)  -No signs of acute exacerbation. No wheezing or rhonchi on auscultation. -When necessary albuterol nebulizer  Sepsis:  - pt meets criteria for sepsis with leukocytosis, HR>90 and RR=20. Pending lactate level. Hemodynamically stable currently. -will get Procalcitonin and trend lactic acid levels per sepsis protocol, neg  Some hypovolemic hyponatremia  HLD: Last LDL was not on record. -Continue home medications: Lipitor  Depression: Stable, no suicidal or homicidal ideations. -Continue home medications: Celexa  History of stroke:  -continue lipitor, Plavix and ASA  Tobacco abuse and Alcohol abuse: -Did counseling about importance of quitting smoking -Nicotine patch -Did counseling about the importance of quitting drinking -CIWA protocol  Hypokalemia:  - Repleted, recheck in a.m.   Code Status : Full  Family Communication  : none at bedside  Disposition Plan  : Home when stable  Consults  :  PCCM, ID  Procedures  : none  DVT Prophylaxis  :  Lovenox  Lab Results  Component Value Date   PLT 303 04/26/2016    Antibiotics  :    Anti-infectives    Start     Dose/Rate Route Frequency Ordered Stop   04/26/16 1800  vancomycin (VANCOCIN) IVPB 1000 mg/200 mL premix     1,000 mg 200 mL/hr over 60 Minutes Intravenous Every 12 hours 04/26/16 1021     04/24/16 2200  vancomycin (VANCOCIN) 500 mg in sodium chloride 0.9 % 100 mL IVPB  Status:  Discontinued     500 mg 100 mL/hr over 60 Minutes Intravenous Every 12 hours 04/24/16 2039 04/26/16 1021   04/24/16 2200  aztreonam (AZACTAM) 1 g in dextrose 5 % 50 mL IVPB     1 g 100 mL/hr over 30 Minutes Intravenous Every 8 hours 04/24/16 2039     04/24/16 2015  metroNIDAZOLE (FLAGYL) IVPB 500 mg     500 mg 100 mL/hr over 60 Minutes Intravenous Every 8 hours 04/24/16 2007          Objective:   Filed Vitals:   04/25/16 2116 04/26/16  0559 04/26/16 0630 04/26/16 1438  BP: 124/75 160/88  129/61  Pulse: 71 98 88 80  Temp: 97.8 F (36.6 C) 99.4 F (37.4 C)  100.4 F (38 C)  TempSrc: Oral Oral  Oral  Resp: '16 18 20 18  '$ Weight:      SpO2: 99% 89% 97% 92%    Wt Readings from Last 3 Encounters:  04/24/16 51.5 kg (113 lb 8.6 oz)  03/16/16 51.5 kg (113 lb 8.6 oz)  02/18/16 48.081 kg (106 lb)     Intake/Output Summary (Last 24 hours) at 04/26/16 1621 Last data filed at 04/26/16 1439  Gross per 24 hour  Intake 2986.67 ml  Output      0 ml  Net 2986.67 ml     Physical Exam    General: No diaphoresis, anxious, no acute distress  Cardiovascular: Regular rate and rhythm no murmurs rubs or gallops  Respiratory: Clear to auscultation bilaterally & incr wob  Abdomen: Nondistended bowel sounds normal nontender palpation Musculoskeletal: Moving all extremities, no deformity, 5 out of 5 strength   Data Review:    CBC  Recent Labs Lab 04/25/16 1216 04/26/16 0334  WBC 18.0* 13.9*  HGB 11.2* 11.2*  HCT 33.6* 34.3*  PLT 282 303  MCV 96.6 97.2  MCH 32.2 31.7  MCHC 33.3 32.7  RDW 13.3 13.5  LYMPHSABS 1.0 1.2  MONOABS 1.6* 1.1*  EOSABS 0.7 1.6*  BASOSABS 0.0 0.0    Chemistries   Recent Labs Lab 04/24/16 2036 04/25/16 1216 04/26/16 0334  NA 130* 133* 133*  K 3.2* 3.4* 3.1*  CL 96* 101 101  CO2 '24 26 24  '$ GLUCOSE 150* 98 86  BUN <5* 5* 5*  CREATININE 0.39* 0.43* 0.38*  CALCIUM 8.6* 8.4* 8.0*  MG 1.5* 2.0 1.7   ------------------------------------------------------------------------------------------------------------------ No results for input(s): CHOL, HDL, LDLCALC, TRIG, CHOLHDL, LDLDIRECT in the last 72 hours.  No results found for: HGBA1C ------------------------------------------------------------------------------------------------------------------ No results for input(s): TSH, T4TOTAL, T3FREE, THYROIDAB in the last 72 hours.  Invalid input(s):  FREET3 ------------------------------------------------------------------------------------------------------------------ No results for input(s): VITAMINB12, FOLATE, FERRITIN, TIBC, IRON, RETICCTPCT in the last 72 hours.  Coagulation profile  Recent Labs Lab 04/24/16 2036  INR 0.98    No results for input(s): DDIMER in the last 72 hours.  Cardiac Enzymes No results for input(s): CKMB, TROPONINI, MYOGLOBIN in the last 168 hours.  Invalid input(s): CK ------------------------------------------------------------------------------------------------------------------    Component Value Date/Time   BNP 129.1* 04/25/2016 0500    Inpatient Medications  Scheduled Meds: . aspirin EC  81 mg Oral Daily  . atorvastatin  10 mg Oral QHS  . aztreonam  1 g Intravenous Q8H  . citalopram  20 mg Oral QHS  . citalopram  40 mg Oral q Valencia - 10a  . clopidogrel  75 mg Oral Daily  . dextromethorphan-guaiFENesin  1 tablet Oral BID  . enoxaparin (LOVENOX) injection  40 mg Subcutaneous Q24H  . feeding supplement (ENSURE ENLIVE)  237 mL Oral BID BM  . folic acid  1 mg Oral Daily  . LORazepam  0-4 mg Intravenous Q6H   Followed by  . LORazepam  0-4 mg Intravenous Q12H  . magnesium sulfate 1 - 4 g bolus IVPB  1 g Intravenous Once  . metronidazole  500 mg Intravenous Q8H  . multivitamin with minerals  1 tablet Oral Daily  . nicotine  21 mg Transdermal Daily  . thiamine  100 mg Oral Daily   Or  . thiamine  100 mg Intravenous Daily  . vancomycin  1,000 mg Intravenous Q12H   Continuous Infusions: . sodium chloride 100 mL/hr at 04/26/16 0633   PRN Meds:.albuterol, LORazepam **OR** LORazepam  Micro Results Recent Results (from the past 240 hour(s))  Culture, blood (x 2)     Status: None (Preliminary result)   Collection Time: 04/24/16  8:36 PM  Result Value Ref Range Status   Specimen Description BLOOD RIGHT ANTECUBITAL  Final   Special Requests BOTTLES DRAWN AEROBIC ONLY 5CC  Final    Culture NO GROWTH < 24 HOURS  Final   Report Status PENDING  Incomplete  Culture, blood (x 2)     Status: None (Preliminary result)   Collection Time: 04/24/16  8:39 PM  Result Value Ref Range Status   Specimen Description BLOOD LEFT HAND  Final   Special Requests BOTTLES DRAWN AEROBIC AND ANAEROBIC 5CC  Final   Culture NO GROWTH < 24 HOURS  Final   Report Status PENDING  Incomplete  C difficile quick scan w PCR reflex     Status: None   Collection Time: 04/25/16 11:09 AM  Result Value Ref Range Status   C Diff antigen NEGATIVE NEGATIVE Final   C Diff toxin NEGATIVE NEGATIVE Final   C Diff interpretation Negative for toxigenic C. difficile  Final    Radiology Reports Ir Radiologist Eval & Mgmt  04/21/2016  EXAM: ESTABLISHED PATIENT OFFICE VISIT CHIEF COMPLAINT: Chest and cold infection.  Pain with passing urine. She denies recent chills, fever or rigors. 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 The patient had an uneventful procedure. The patient  did develop some symptoms of breathing difficulties which responded to 2 albuterol inhalation treatments. The patient was then discharged and had a chest x-ray which revealed the presence of a left lower lobe consolidation probably inflammatory. The patient was then treated with antibiotics for about 7 days with improvement. However, she was then started on a short course of prednisone which significantly improved her breathing difficulties and improved her appetite. However, since she stopped taking the prednisone, the patient has developed continuous diarrhea which is watery and without any noticeable reddish discoloration. These are associated mild tummy aches. Her appetite is decreased and her weight loss is related to her lack of appetite and diarrhea.  She continues to have a mild cough occasionally associated with phlegm production. However, she denies any hemoptysis. She also has symptoms of urgency of micturition, however, she predates that to her procedure and has been experiencing this for about 3-4 months. She denies any hematuria or dysuria. She has noticed, however, easy bruisability in her skin on her forearms and also her thighs. Her past medical history, previous surgical history, social history, family history as per her H and P for from 3 weeks ago, unchanged. Present medications: Aspirin 81 mg a day. Plavix 75 mg a day. Atorvastatin. Celexa. Combivent. Vitamin-D, Ergocalciferol. Allergies: Penicillins which causes anaphylaxis and shortness of breath. PHYSICAL EXAMINATION: In no acute distress. However, appears pale and thin. Affect normal. Neurological fully intact. ASSESSMENT AND PLAN: The patient's immediate post treatment arteriograms were reviewed with her and her friend. These depict placement of a pipeline flow diverter device with stasis within the endovascularly treated aneurysm involving the left internal carotid artery intracranially. Also noted was a chest x-ray performed which depicted the presence of a left lower lobe consolidation probably inflammatory. The plan today is to obtain a platelet inhibition study P2Y12. Additionally the patient has been advised to revisit with her primary care doctor for consideration of repeat chest x-ray if warranted. Also the patient has been advised to drink plenty of liquids because of her diarrhea and to review her symptoms of diarrhea with her primary care physician as quickly as possible. 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 Questions were answered to her satisfaction. The patient was asked to  call should she have any concerns or questions. Electronically Signed   By: Luanne Bras M.D.   On: 04/17/2016 12:25    Time Spent in minutes  25 minutes   Cattleya Dobratz M.D on 04/26/2016 at 4:21 PM  Between 7am to 7pm - Pager - [phone]  After 7pm go to [email or web address] - password [facility]  [facility] -  Office  [phone]

## 2016-04-26 NOTE — Progress Notes (Addendum)
PULMONARY CONSULT   Name: Kylie Valencia MRN: [id number] DOB: 28-Feb-1956    ADMISSION DATE:  04/24/2016 CONSULTATION DATE:  6/16  REFERRING MD :    CHIEF COMPLAINT:  Cavitary PNA   BRIEF PATIENT DESCRIPTION:  60 year old female initially admitted to [facility] w/ CC: shortness of breath and cough. Has PMH sig for ETOH abuse, COPD, depression and most recently a CVA back in Feb 2017. Since d/c she has had multiple rounds of abx for recurrent PNAs. Most recently she had gone to see her PCP on 6/15 w/ cc cough and SOB. She was placed on Levaquin and CXR was obtained. This raised concern for cavitation in the LLL so she was referred to the ER for further eval. Further eval in the ER consisted of CT chest which showed : LLL lung lesion w/ associated lymphadenopathy. [facility].   ->ETOH daily -> dental caries  -> recent stroke  SIGNIFICANT EVENTS    STUDIES:  CT chest 6/15: cavitary LLL lung lesion w/ associated infiltrate. Increased hilar adenopathy   SUBJECTIVE:  Feels better this AM.  VITAL SIGNS: Temp:  [97.8 F (36.6 C)-99.4 F (37.4 C)] 99.4 F (37.4 C) (06/18 0559) Pulse Rate:  [71-98] 88 (06/18 0630) Resp:  [16-20] 20 (06/18 0630) BP: (117-160)/(61-88) 160/88 mmHg (06/18 0559) SpO2:  [89 %-99 %] 97 % (06/18 0630)  PHYSICAL EXAMINATION: General:  Awake, alert, no distress.  Neuro:  Awake, alert, no focal def HEENT:  [name], has partials upper and lower. [place]/AT, PERRL, EOM-I and MMM. Cardiovascular:  RRR w/out MRG Lungs:  Diffuse rhonchi. + bowel sounds  Abdomen:  Soft, not tender + bowel sounds  Musculoskeletal:  Equal st and bulk  Skin:  Warm and dry    Recent Labs Lab 04/24/16 2036 04/25/16 1216 04/26/16 0334  NA 130* 133* 133*  K 3.2* 3.4* 3.1*  CL 96* 101 101  CO2 '24 26 24  '$ BUN <5* 5* 5*  CREATININE 0.39* 0.43* 0.38*  GLUCOSE 150* 98 86   ASSESSMENT / PLAN:  60 year old female with known  cavitation noted on CT on February of this year who presents with a pneumonia and multiple pulmonary nodules. On exam, crackles bibasilarly and rhonchi L>R. I reviewed chest CT myself, cavitation with debris noted, surrounding infiltrate and multiple pulmonary nodules. Discussed with PCCM-NP.  Pulmonary nodules/?mass: differential very wide. - Bronch on Monday, attempting to place on the schedule, patient made aware not to eat after midnight and if unable to do tomorrow then will do on Tuesday. - Inspect left airway and evaluate for obstruction.  Cavitary pneumonia/lesion: etoh abuse so likely progressive aspiration and adding a stroke makes chronic aspiration very likely with cavitary pneumonia but can not r/o post obstructive with smoking history. - Flagyl/aztreonam/vanc. - F/U on cultures. - Would call ID to make recommendations on long term abx. - Plan on bronch on Monday.  Hypoxemia: - Titrate O2 for sat of 88-92%. - Ambulatory desat prior to discharge for home O2.  COPD: - PRN albuterol.    - ICS. - Will need PFT's once fully treated for PNA.  Patient seen and examined, agree with above note. I dictated the care and orders written for this patient under my direction.  Rush Farmer, MD [phone]

## 2016-04-26 NOTE — Progress Notes (Signed)
Pharmacy Antibiotic Note  Kylie Valencia is a 60 y.o. female admitted to [facility] 6/15 by PCP with a chest x-ray showing a lung abnormality. CT showed a lower lobe cavitary lesion/pneumonia. Transferred to [facility] 6/16 for further evaluation and possible bronchoscopy. CCM now planning bronch on 6/19. Pharmacy consulted for Vancomycin + Azactam + Flagyl for empiric cavitary PNA coverage.    A Vancomycin trough this morning resulted as SUBtherapeutic (VT 7 mcg/ml, goal of 15-20 mcg/ml), SCr 0.38, CrCl~60 ml/min. Will increase the Vanc dose today. Azactam and Flagyl doses remain appropriate.   Goal of Therapy: Vancomycin trough of 15-20 mcg/ml  Plan: 1. Increase Vancomycin to 1g IV every 12 hours 2. Continue Azactam 2g IV every 8 hours 3. Continue Flagyl 500 mg IV every 8 hours 4. Will continue to follow renal function, culture results, LOT, and antibiotic de-escalation plans   Weight: 113 lb 8.6 oz (51.5 kg)  Temp (24hrs), Avg:98.4 F (36.9 C), Min:97.8 F (36.6 C), Max:99.4 F (37.4 C)   Recent Labs Lab 04/24/16 2036 04/24/16 2354 04/25/16 1216 04/26/16 0334 04/26/16 0908  WBC  --   --  18.0* 13.9*  --   CREATININE 0.39*  --  0.43* 0.38*  --   LATICACIDVEN 1.3 1.0  --   --   --   VANCOTROUGH  --   --   --   --  7*    Estimated Creatinine Clearance: 60 mL/min (by C-G formula based on Cr of 0.38).    Allergies  Allergen Reactions  . Penicillins Anaphylaxis, Shortness Of Breath and Other (See Comments)    Has patient had a PCN reaction causing immediate rash, facial/tongue/throat swelling, SOB or lightheadedness with hypotension: Yes Has patient had a PCN reaction causing severe rash involving mucus membranes or skin necrosis: No Has patient had a PCN reaction that required hospitalization Yes Has patient had a PCN reaction occurring within the last 10 years: No If all of the above answers are "NO", then may proceed with Cephalosporin use.     Antimicrobials this  admission: Vanc 6/16 >> Azactam 6/16 >> Flagyl 6/16 >>  Dose adjustments this admission: * 6/18 VT 7 mcg/ml (1 hr early) >> adjust to 1g/12h  Microbiology results: 6/16 BCx >> ngtd 6/16 RCx >> 6/16 CDiff >> neg  Thank you for allowing pharmacy to be a part of this patient's care.  Alycia Rossetti, PharmD, BCPS Clinical Pharmacist Pager: [phone] 04/26/2016 10:29 AM

## 2016-04-27 DIAGNOSIS — F1020 Alcohol dependence, uncomplicated: Secondary | ICD-10-CM

## 2016-04-27 DIAGNOSIS — Z9981 Dependence on supplemental oxygen: Secondary | ICD-10-CM

## 2016-04-27 DIAGNOSIS — F17200 Nicotine dependence, unspecified, uncomplicated: Secondary | ICD-10-CM

## 2016-04-27 LAB — CBC
HCT: 38.6 % (ref 36.0–46.0)
Hemoglobin: 12.5 g/dL (ref 12.0–15.0)
MCH: 31.8 pg (ref 26.0–34.0)
MCHC: 32.4 g/dL (ref 30.0–36.0)
MCV: 98.2 fL (ref 78.0–100.0)
Platelets: 327 10*3/uL (ref 150–400)
RBC: 3.93 MIL/uL (ref 3.87–5.11)
RDW: 13.5 % (ref 11.5–15.5)
WBC: 13.3 10*3/uL — AB (ref 4.0–10.5)

## 2016-04-27 LAB — BASIC METABOLIC PANEL
Anion gap: 7 (ref 5–15)
BUN: <5 mg/dL — ABNORMAL LOW (ref 6–20)
CHLORIDE: 100 mmol/L — AB (ref 101–111)
CO2: 27 mmol/L (ref 22–32)
CREATININE: 0.35 mg/dL — AB (ref 0.44–1.00)
Calcium: 8 mg/dL — ABNORMAL LOW (ref 8.9–10.3)
GFR calc non Af Amer: >60 mL/min (ref >60)
GLUCOSE: 96 mg/dL (ref 65–99)
Potassium: 3.5 mmol/L (ref 3.5–5.1)
Sodium: 134 mmol/L — ABNORMAL LOW (ref 135–145)

## 2016-04-27 MED ORDER — METRONIDAZOLE 500 MG PO TABS
500.0000 mg | ORAL_TABLET | Freq: Three times a day (TID) | ORAL | Status: DC
  Administered 2016-04-27 – 2016-04-30 (×9): 500 mg via ORAL
  Filled 2016-04-27 (×9): qty 1

## 2016-04-27 NOTE — Progress Notes (Signed)
PROGRESS NOTE                                                                                                                                                                                                             Patient Demographics:    Kylie Valencia, is a 60 y.o. female, DOB - 11/03/1956, INO:[id number]  Admit date - 04/24/2016   Admitting Physician Ivor Costa, MD  Outpatient Primary MD for the patient is Charletta Cousin, MD  LOS - 3   No chief complaint on file.      Brief Narrative   60 year old femaleWith past medical history of COPD, alcohol abuse, depression, CVA,  admitted initially trend of hospital for dyspnea and cough, she had multiple rounds of antibiotics for recurrent pneumonia treatment, CT chest significant for left lower lung lesion with associated lymphadenopathy especially is for cavitary pneumonia, admitted for further management, and by [facility] a.m., plan for bronchoscopy today or tomorrow    Subjective:    Kylie Valencia today has, No headache, No chest pain, No abdominal pain , Complains of cough   Assessment  & Plan :    Principal Problem:   Cavitary pneumonia Active Problems:   Brain aneurysm   Hyperlipidemia   Depression   Peripheral vascular disease (HCC)   COPD (chronic obstructive pulmonary disease) (HCC)   Stroke ([place])   Tobacco abuse   Pneumonia   Protein-calorie malnutrition-moderate   Solitary pulmonary nodule   Lung mass   Hypoxemia  1. Cavitary pneumonia: As showed by CT angiogram. This is concerning for lung abscess. She has low risk of TB. ABG showed pH of 7.48, PCO2 36, PO2 47. -cont IV Vancomycin and Aztreonam and Flagyl, cont, ID consulted regarding long-term enteric management - Urine legionella and S. pneumococcal antigen - Seen by PCCM., and plan for bronchoscopy today or tomorrow. Check anca  2. COPD (chronic obstructive pulmonary disease) (HCC) No signs of acute exacerbation. No wheezing or  rhonchi on auscultation. Cont as necessary albuterol nebulizer  3. Sepsis: pt meets criteria for sepsis with leukocytosis, HR>90 and RR=20. Pending lactate level. Hemodynamically stable currently. -cont IV atx, blood cultures: NGTD  4. HLD: Last LDL was not on record. Continue home medications: Lipitor  5. Depression: Stable, no suicidal or homicidal ideations. Continue home medications: Celexa  6. History of stroke: continue lipitor, Plavix  and ASA  7. Tobacco abuse and Alcohol abuse: Did counseling about importance of quitting smoking. Cont nicotine patch -Did counseling about the importance of quitting drinking CIWA protocol  8. Hypokalemia:  Repleted   Code Status : Full  Family Communication  : none at bedside  Disposition Plan  : Home when stable  Consults  :  PCCM, ID  Procedures  : none  DVT Prophylaxis  :  Lovenox  Lab Results  Component Value Date   PLT 327 04/27/2016    Antibiotics  :    Anti-infectives    Start     Dose/Rate Route Frequency Ordered Stop   04/26/16 1800  vancomycin (VANCOCIN) IVPB 1000 mg/200 mL premix     1,000 mg 200 mL/hr over 60 Minutes Intravenous Every 12 hours 04/26/16 1021     04/24/16 2200  vancomycin (VANCOCIN) 500 mg in sodium chloride 0.9 % 100 mL IVPB  Status:  Discontinued     500 mg 100 mL/hr over 60 Minutes Intravenous Every 12 hours 04/24/16 2039 04/26/16 1021   04/24/16 2200  aztreonam (AZACTAM) 1 g in dextrose 5 % 50 mL IVPB     1 g 100 mL/hr over 30 Minutes Intravenous Every 8 hours 04/24/16 2039     04/24/16 2015  metroNIDAZOLE (FLAGYL) IVPB 500 mg     500 mg 100 mL/hr over 60 Minutes Intravenous Every 8 hours 04/24/16 2007          Objective:   Filed Vitals:   04/26/16 1745 04/26/16 1746 04/26/16 2125 04/27/16 0545  BP:  143/81 151/69 164/83  Pulse:  74 69 96  Temp: 98.4 F (36.9 C)  98.5 F (36.9 C) 99.1 F (37.3 C)  TempSrc:   Oral Oral  Resp:   18 18  Weight:      SpO2:   99% 97%    Wt Readings  from Last 3 Encounters:  04/24/16 51.5 kg (113 lb 8.6 oz)  03/16/16 51.5 kg (113 lb 8.6 oz)  02/18/16 48.081 kg (106 lb)     Intake/Output Summary (Last 24 hours) at 04/27/16 1115 Last data filed at 04/27/16 1037  Gross per 24 hour  Intake   3790 ml  Output   2800 ml  Net    990 ml     Physical Exam    General: No diaphoresis, anxious, no acute distress  Cardiovascular: Regular rate and rhythm no murmurs rubs or gallops  Respiratory: Clear to auscultation bilaterally & incr wob  Abdomen: Nondistended bowel sounds normal nontender palpation Musculoskeletal: Moving all extremities, no deformity, 5 out of 5 strength   Data Review:    CBC  Recent Labs Lab 04/25/16 1216 04/26/16 0334 04/27/16 0637  WBC 18.0* 13.9* 13.3*  HGB 11.2* 11.2* 12.5  HCT 33.6* 34.3* 38.6  PLT 282 303 327  MCV 96.6 97.2 98.2  MCH 32.2 31.7 31.8  MCHC 33.3 32.7 32.4  RDW 13.3 13.5 13.5  LYMPHSABS 1.0 1.2  --   MONOABS 1.6* 1.1*  --   EOSABS 0.7 1.6*  --   BASOSABS 0.0 0.0  --     Chemistries   Recent Labs Lab 04/24/16 2036 04/25/16 1216 04/26/16 0334 04/27/16 0637  NA 130* 133* 133* 134*  K 3.2* 3.4* 3.1* 3.5  CL 96* 101 101 100*  CO2 '24 26 24 27  '$ GLUCOSE 150* 98 86 96  BUN <5* 5* 5* PENDING  CREATININE 0.39* 0.43* 0.38* 0.35*  CALCIUM 8.6* 8.4* 8.0* 8.0*  MG  1.5* 2.0 1.7  --    ------------------------------------------------------------------------------------------------------------------ No results for input(s): CHOL, HDL, LDLCALC, TRIG, CHOLHDL, LDLDIRECT in the last 72 hours.  No results found for: HGBA1C ------------------------------------------------------------------------------------------------------------------ No results for input(s): TSH, T4TOTAL, T3FREE, THYROIDAB in the last 72 hours.  Invalid input(s): FREET3 ------------------------------------------------------------------------------------------------------------------ No results for input(s):  VITAMINB12, FOLATE, FERRITIN, TIBC, IRON, RETICCTPCT in the last 72 hours.  Coagulation profile  Recent Labs Lab 04/24/16 2036  INR 0.98    No results for input(s): DDIMER in the last 72 hours.  Cardiac Enzymes No results for input(s): CKMB, TROPONINI, MYOGLOBIN in the last 168 hours.  Invalid input(s): CK ------------------------------------------------------------------------------------------------------------------    Component Value Date/Time   BNP 129.1* 04/25/2016 0500    Inpatient Medications  Scheduled Meds: . aspirin EC  81 mg Oral Daily  . atorvastatin  10 mg Oral QHS  . aztreonam  1 g Intravenous Q8H  . citalopram  20 mg Oral QHS  . citalopram  40 mg Oral q Valencia - 10a  . clopidogrel  75 mg Oral Daily  . dextromethorphan-guaiFENesin  1 tablet Oral BID  . enoxaparin (LOVENOX) injection  40 mg Subcutaneous Q24H  . feeding supplement (ENSURE ENLIVE)  237 mL Oral BID BM  . folic acid  1 mg Oral Daily  . LORazepam  0-4 mg Intravenous Q12H  . metronidazole  500 mg Intravenous Q8H  . multivitamin with minerals  1 tablet Oral Daily  . nicotine  21 mg Transdermal Daily  . thiamine  100 mg Oral Daily  . vancomycin  1,000 mg Intravenous Q12H   Continuous Infusions: . sodium chloride 100 mL/hr at 04/27/16 1050   PRN Meds:.albuterol, LORazepam **OR** LORazepam  Micro Results Recent Results (from the past 240 hour(s))  Culture, blood (x 2)     Status: None (Preliminary result)   Collection Time: 04/24/16  8:36 PM  Result Value Ref Range Status   Specimen Description BLOOD RIGHT ANTECUBITAL  Final   Special Requests BOTTLES DRAWN AEROBIC ONLY 5CC  Final   Culture NO GROWTH 2 DAYS  Final   Report Status PENDING  Incomplete  Culture, blood (x 2)     Status: None (Preliminary result)   Collection Time: 04/24/16  8:39 PM  Result Value Ref Range Status   Specimen Description BLOOD LEFT HAND  Final   Special Requests BOTTLES DRAWN AEROBIC AND ANAEROBIC 5CC  Final     Culture NO GROWTH 2 DAYS  Final   Report Status PENDING  Incomplete  C difficile quick scan w PCR reflex     Status: None   Collection Time: 04/25/16 11:09 AM  Result Value Ref Range Status   C Diff antigen NEGATIVE NEGATIVE Final   C Diff toxin NEGATIVE NEGATIVE Final   C Diff interpretation Negative for toxigenic C. difficile  Final    Radiology Reports Ir Radiologist Eval & Mgmt  04/21/2016  EXAM: ESTABLISHED PATIENT OFFICE VISIT CHIEF COMPLAINT: Chest and cold infection.  Pain with passing urine. She denies recent chills, fever or rigors. 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 The patient had an uneventful procedure. The patient did develop some symptoms of breathing difficulties which responded to 2 albuterol inhalation treatments. The patient was then discharged and had a chest x-ray which revealed the presence of a left lower lobe consolidation probably inflammatory. The  patient was then treated with antibiotics for about 7 days with improvement. However, she was then started on a short course of prednisone which significantly improved her breathing difficulties and improved her appetite. However, since she stopped taking the prednisone, the patient has developed continuous diarrhea which is watery and without any noticeable reddish discoloration. These are associated mild tummy aches. Her appetite is decreased and her weight loss is related to her lack of appetite and diarrhea. She continues to have a mild cough occasionally associated with phlegm production. However, she denies any hemoptysis. She also has symptoms of urgency of micturition, however, she predates that to her procedure and has been experiencing this for about 3-4 months. She denies any hematuria or dysuria. She has noticed,  however, easy bruisability in her skin on her forearms and also her thighs. Her past medical history, previous surgical history, social history, family history as per her H and P for from 3 weeks ago, unchanged. Present medications: Aspirin 81 mg a day. Plavix 75 mg a day. Atorvastatin. Celexa. Combivent. Vitamin-D, Ergocalciferol. Allergies: Penicillins which causes anaphylaxis and shortness of breath. PHYSICAL EXAMINATION: In no acute distress. However, appears pale and thin. Affect normal. Neurological fully intact. ASSESSMENT AND PLAN: The patient's immediate post treatment arteriograms were reviewed with her and her friend. These depict placement of a pipeline flow diverter device with stasis within the endovascularly treated aneurysm involving the left internal carotid artery intracranially. Also noted was a chest x-ray performed which depicted the presence of a left lower lobe consolidation probably inflammatory. The plan today is to obtain a platelet inhibition study P2Y12. Additionally the patient has been advised to revisit with her primary care doctor for consideration of repeat chest x-ray if warranted. Also the patient has been advised to drink plenty of liquids because of her diarrhea and to review her symptoms of diarrhea with her primary care physician as quickly as possible. 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 Questions were answered to her satisfaction. The patient was asked to call should she have any concerns or questions. Electronically Signed   By: Luanne Bras M.D.   On: 04/17/2016 12:25    Time Spent in minutes  25 minutes   Kinnie Feil M.D on 04/27/2016 at 11:15 AM  Between 7am to 7pm - Pager - [phone]  After 7pm go to [email or web address] - password [facility]  [facility] -  Office  [phone]

## 2016-04-27 NOTE — Consult Note (Signed)
[place] for Infectious Disease          Reason for Consult: Cavitary Lung Lesion    Referring Physician: [facility]  Principal Problem:   Cavitary pneumonia Active Problems:   Brain aneurysm   Hyperlipidemia   Depression   Peripheral vascular disease (HCC)   COPD (chronic obstructive pulmonary disease) (HCC)   Stroke (HCC)   Tobacco abuse   Pneumonia   Protein-calorie malnutrition-moderate   Solitary pulmonary nodule   Lung mass   Hypoxemia  HPI: Kylie Valencia is a 60 y.o. female with PMHx of alcohol abuse, tobacco abuse, HLD, COPD, PVD, HLD, CVA, Depression, and brain aneurysm who presented from [facility] with complaints of cough and shortness of breath.   After patient's CVA in February 2017, she was diagnosed with recurrent pneumonias and has had multiple courses of antibiotics, including Doxycycline 2 weeks ago and Levaquin most recently. Her PCP noticed an abnormal finding on CXR and sent her to [facility] where CTA chest showed a left lower lobe cavitary lesion with significant increase in infiltration and adenopathy. In the ED, patient was afebrile, but with leukocytosis, hypokalemia, hyponatremia. Given concern for lung abscess, patient was started on Vancomycin, Aztreonam, and Flagyl on 6/16. Patient was seen by PCCM who plans on bronchoscopy tomorrow am.    Patient was seen and examined this morning. Patient reports increased shortness of breath above baseline and a productive cough of "multi-color" sputum. She admits to associated hemoptysis for the past 2 months, weight loss, and fever and chills. She reports chronic night sweats for "years." She admits to diarrhea with 5-6 bowel movements a day for the past 2 weeks associated with urgency and occuring after meals. She denies melena or hematochezia. She denies nausea, vomiting or rashes.   Patient denies history of exposure to TB, incarceration, homelessness or travel. She does not work with any  communities with immigrants. She denies any family history of TB. She admits to tobacco use 1 ppd for 40 years. She admits to alcohol use- 3 beers per day.   Past Medical History  Diagnosis Date  . Hyperlipidemia     takes Lipitor nightly  . Depression     takes Citalopram nightly  . Peripheral vascular disease ([place]) 20+ yrs ago  . COPD (chronic obstructive pulmonary disease) (HCC)     Combivent as needed  . Smokers' cough ([place])   . Pneumonia Feb 14,2017  . Cavitary pneumonia 04/24/2016  . Pneumonia <2017     "several times" (04/23/2016)  . Stroke ([place]) 12/2015    denies residual on 04/24/2016  . Anxiety     Allergies:  Allergies  Allergen Reactions  . Penicillins Anaphylaxis, Shortness Of Breath and Other (See Comments)    Has patient had a PCN reaction causing immediate rash, facial/tongue/throat swelling, SOB or lightheadedness with hypotension: Yes Has patient had a PCN reaction causing severe rash involving mucus membranes or skin necrosis: No Has patient had a PCN reaction that required hospitalization Yes Has patient had a PCN reaction occurring within the last 10 years: No If all of the above answers are "NO", then may proceed with Cephalosporin use.     MEDICATIONS: . aspirin EC  81 mg Oral Daily  . atorvastatin  10 mg Oral QHS  . aztreonam  1 g Intravenous Q8H  . citalopram  20 mg Oral QHS  . citalopram  40 mg Oral q morning - 10a  . clopidogrel  75 mg Oral Daily  .  dextromethorphan-guaiFENesin  1 tablet Oral BID  . enoxaparin (LOVENOX) injection  40 mg Subcutaneous Q24H  . feeding supplement (ENSURE ENLIVE)  237 mL Oral BID BM  . folic acid  1 mg Oral Daily  . LORazepam  0-4 mg Intravenous Q12H  . metronidazole  500 mg Intravenous Q8H  . multivitamin with minerals  1 tablet Oral Daily  . nicotine  21 mg Transdermal Daily  . thiamine  100 mg Oral Daily  . vancomycin  1,000 mg Intravenous Q12H    Social History  Substance Use Topics  . Smoking status:  Current Every Day Smoker -- 0.50 packs/day for 45 years  . Smokeless tobacco: Never Used  . Alcohol Use: 12.6 oz/week    21 Cans of beer per week     Comment: 3 beers a day    Family History  Problem Relation Age of Onset  . Adopted: Yes    Review of Systems: General: Admits to fever, chills, fatigue, change in appetite, weight loss and night sweats. Denies diaphoresis.  Respiratory: Admits to SOB, productive cough, DOE, and hemoptysis. Denies chest tightness, and wheezing.   Cardiovascular: Denies chest pain and palpitations.  Gastrointestinal: Admits to diarrhea. Denies nausea, vomiting, abdominal pain, constipation, blood in stool and abdominal distention.  Genitourinary: Denies dysuria, urgency, frequency. Endocrine: Denies polyuria, and polydipsia. Musculoskeletal: Denies myalgias, back pain.  Skin: Denies rash and wounds.  Neurological: Denies dizziness, headaches, weakness, lightheadedness Psychiatric/Behavioral: Denies mood changes, confusion   OBJECTIVE: Filed Vitals:   04/26/16 1746 04/26/16 2125 04/27/16 0545 04/27/16 1418  BP: 143/81 151/69 164/83 151/81  Pulse: 74 69 96 87  Temp:  98.5 F (36.9 C) 99.1 F (37.3 C) 98.9 F (37.2 C)  TempSrc:  Oral Oral Oral  Resp:  18 18 16   Weight:      SpO2:  99% 97% 91%   General: Vital signs reviewed.  Patient is thin, in no acute distress and cooperative with exam.  Head: Normocephalic and atraumatic. Eyes: Conjunctivae normal, no scleral icterus.  Neck: Supple, trachea midline, no anterior or posterior cervical adenopathy. No supraclavicular adenopathy.  Cardiovascular: RRR, S1 normal, S2 normal, no murmurs, gallops, or rubs. Pulmonary/Chest: Decreased breath sounds in bilaterally bases, inspiratory crackles in lower left lung field, scattered expiratory wheezes.  Abdominal: Soft, non-tender, non-distended, BS +.  Extremities: No lower extremity edema bilaterally, pulses symmetric and intact bilaterally. Neurological:  Awake, alert, oriented Skin: Warm, dry and intact. No rashes or erythema. Psychiatric: Normal mood and affect. speech and behavior is normal. Cognition and memory are grossly normal.    LABS: Results for orders placed or performed during the hospital encounter of 04/24/16 (from the past 48 hour(s))  Basic metabolic panel     Status: Abnormal   Collection Time: 04/26/16  3:34 AM  Result Value Ref Range   Sodium 133 (L) 135 - 145 mmol/L   Potassium 3.1 (L) 3.5 - 5.1 mmol/L   Chloride 101 101 - 111 mmol/L   CO2 24 22 - 32 mmol/L   Glucose, Bld 86 65 - 99 mg/dL   BUN 5 (L) 6 - 20 mg/dL   Creatinine, Ser 0.38 (L) 0.44 - 1.00 mg/dL   Calcium 8.0 (L) 8.9 - 10.3 mg/dL   GFR calc non Af Amer >60 >60 mL/min   GFR calc Af Amer >60 >60 mL/min    Comment: (NOTE) The eGFR has been calculated using the CKD EPI equation. This calculation has not been validated in all clinical situations. eGFR's persistently <  60 mL/min signify possible Chronic Kidney Disease.    Anion gap 8 5 - 15  CBC with Differential/Platelet     Status: Abnormal   Collection Time: 04/26/16  3:34 AM  Result Value Ref Range   WBC 13.9 (H) 4.0 - 10.5 K/uL   RBC 3.53 (L) 3.87 - 5.11 MIL/uL   Hemoglobin 11.2 (L) 12.0 - 15.0 g/dL   HCT 34.3 (L) 36.0 - 46.0 %   MCV 97.2 78.0 - 100.0 fL   MCH 31.7 26.0 - 34.0 pg   MCHC 32.7 30.0 - 36.0 g/dL   RDW 13.5 11.5 - 15.5 %   Platelets 303 150 - 400 K/uL   Neutrophils Relative % 71 %   Neutro Abs 10.0 (H) 1.7 - 7.7 K/uL   Lymphocytes Relative 9 %   Lymphs Abs 1.2 0.7 - 4.0 K/uL   Monocytes Relative 8 %   Monocytes Absolute 1.1 (H) 0.1 - 1.0 K/uL   Eosinophils Relative 12 %   Eosinophils Absolute 1.6 (H) 0.0 - 0.7 K/uL   Basophils Relative 0 %   Basophils Absolute 0.0 0.0 - 0.1 K/uL  Magnesium     Status: None   Collection Time: 04/26/16  3:34 AM  Result Value Ref Range   Magnesium 1.7 1.7 - 2.4 mg/dL  Phosphorus     Status: None   Collection Time: 04/26/16  3:34 AM  Result  Value Ref Range   Phosphorus 2.7 2.5 - 4.6 mg/dL  Vancomycin, trough     Status: Abnormal   Collection Time: 04/26/16  9:08 AM  Result Value Ref Range   Vancomycin Tr 7 (L) 10.0 - 20.0 ug/mL  CBC     Status: Abnormal   Collection Time: 04/27/16  6:37 AM  Result Value Ref Range   WBC 13.3 (H) 4.0 - 10.5 K/uL   RBC 3.93 3.87 - 5.11 MIL/uL   Hemoglobin 12.5 12.0 - 15.0 g/dL   HCT 38.6 36.0 - 46.0 %   MCV 98.2 78.0 - 100.0 fL   MCH 31.8 26.0 - 34.0 pg   MCHC 32.4 30.0 - 36.0 g/dL   RDW 13.5 11.5 - 15.5 %   Platelets 327 150 - 400 K/uL  Basic metabolic panel     Status: Abnormal   Collection Time: 04/27/16  6:37 AM  Result Value Ref Range   Sodium 134 (L) 135 - 145 mmol/L   Potassium 3.5 3.5 - 5.1 mmol/L   Chloride 100 (L) 101 - 111 mmol/L   CO2 27 22 - 32 mmol/L   Glucose, Bld 96 65 - 99 mg/dL   BUN <5 (L) 6 - 20 mg/dL   Creatinine, Ser 0.35 (L) 0.44 - 1.00 mg/dL   Calcium 8.0 (L) 8.9 - 10.3 mg/dL   GFR calc non Af Amer >60 >60 mL/min   GFR calc Af Amer >60 >60 mL/min    Comment: (NOTE) The eGFR has been calculated using the CKD EPI equation. This calculation has not been validated in all clinical situations. eGFR's persistently <60 mL/min signify possible Chronic Kidney Disease.    Anion gap 7 5 - 15    Micro: BCx 6/16>> NGTD C Diff>> Negative Sputum Cx pending Respiratory Panel PCR pending  Antibiotics: Aztreonam 6/16>> Flagyl 6/16>> Vancomycin 6/16>>  Assessment/Plan:   Kylie Valencia is a 75 F with PMHx of alcohol abuse, tobacco abuse, HLD, COPD, PVD, HLD, CVA, Depression, and brain aneurysm who presented from [facility] with complaints of cough and shortness of breath and  found to have a cavitary LLL lesion with associated infiltrate and hilar adenopathy.  Cavitary Lung Lesion: Patient has had recurrent shortness of breath and cough since February 2017. CTA chest showed a cavitary LLL lesions with associated infiltrate and hilar adenopathy. Differential is  broad, but likely bacterial. Unlikely to be secondary to TB given no known risk factors.  -Plans for bronchoscopy tomorrow -Follow up cultures -Continue aztreonam and vancomycin -Transition Flagyl to po given equivalent bioavailability -Patient should avoid alcohol while on Flagyl  Tobacco and Alcohol abuse: -On nicotine patch -On CIWA -Avoid alcohol while on Flagyl  COPD: Requiring oxygen via nasal cannula, but not in an acute exacerbation. -Albuterol prn -Supplemental O2  Martyn Malay, DO PGY-2 Internal Medicine Resident Pager # [phone] 04/27/2016 3:47 PM

## 2016-04-27 NOTE — Progress Notes (Signed)
MD contacted nurse to notify that bronchoscopy would be rescheduled for tomorrow morning. Verbal order to advance diet and make pt NPO starting midnight tonight. Pt notified.

## 2016-04-27 NOTE — Progress Notes (Signed)
PULMONARY CONSULT   Name: Kylie Valencia MRN: [id number] DOB: 1955/12/14    ADMISSION DATE:  04/24/2016 CONSULTATION DATE:  6/16  REFERRING MD :    CHIEF COMPLAINT:  Cavitary PNA   BRIEF PATIENT DESCRIPTION:  60 year old female initially admitted to [facility] w/ CC: shortness of breath and cough. Has PMH sig for ETOH abuse, COPD, depression and most recently a CVA back in Feb 2017. Since d/c she has had multiple rounds of abx for recurrent PNAs. Most recently she had gone to see her PCP on 6/15 w/ cc cough and SOB. She was placed on Levaquin and CXR was obtained. This raised concern for cavitation in the LLL so she was referred to the ER for further eval. Further eval in the ER consisted of CT chest which showed : LLL lung lesion w/ associated lymphadenopathy. [facility].   ->ETOH daily -> dental caries  -> recent stroke  SIGNIFICANT EVENTS    STUDIES:  CT chest 6/15: cavitary LLL lung lesion w/ associated infiltrate. Increased hilar adenopathy   SUBJECTIVE:  Feels better this AM, no overnight events.  VITAL SIGNS: Temp:  [98.4 F (36.9 C)-100.4 F (38 C)] 98.9 F (37.2 C) (06/19 1418) Pulse Rate:  [69-96] 87 (06/19 1418) Resp:  [16-18] 16 (06/19 1418) BP: (129-164)/(61-83) 151/81 mmHg (06/19 1418) SpO2:  [91 %-99 %] 91 % (06/19 1418)  PHYSICAL EXAMINATION: General:  Awake, alert, no distress.  Neuro:  Awake, alert, no focal def HEENT:  [place], has partials upper and lower. [place]/AT, PERRL, EOM-I and MMM. Cardiovascular:  RRR w/out MRG Lungs:  Diffuse rhonchi. + bowel sounds  Abdomen:  Soft, not tender + bowel sounds  Musculoskeletal:  Equal st and bulk  Skin:  Warm and dry    Recent Labs Lab 04/25/16 1216 04/26/16 0334 04/27/16 0637  NA 133* 133* 134*  K 3.4* 3.1* 3.5  CL 101 101 100*  CO2 '26 24 27  '$ BUN 5* 5* <5*  CREATININE 0.43* 0.38* 0.35*  GLUCOSE 98 86 96   ASSESSMENT / PLAN:  60 year old female  with known cavitation noted on CT on February of this year who presents with a pneumonia and multiple pulmonary nodules. On exam, crackles bibasilarly and rhonchi L>R. I reviewed chest CT myself, cavitation with debris noted, surrounding infiltrate and multiple pulmonary nodules. Discussed with PCCM-NP.  Pulmonary nodules/?mass: differential very wide. - Bronch on Tuesday 1 PM. - Inspect left airway and evaluate for obstruction.     - NPO after midnight.  Cavitary pneumonia/lesion: etoh abuse so likely progressive aspiration and adding a stroke makes chronic aspiration very likely with cavitary pneumonia but can not r/o post obstructive with smoking history. - Flagyl/aztreonam/vanc until BAL cultures result. - F/U on cultures. - Would call ID to make recommendations on long term abx. - Plan on bronch on Tuesday.  Hypoxemia: - Titrate O2 for sat of 88-92%. - Ambulatory desat prior to discharge for home O2.  COPD: - PRN albuterol.    - ICS. - Will need PFT's once fully treated for PNA.  Patient seen and examined, agree with above note. I dictated the care and orders written for this patient under my direction.  Rush Farmer, MD [phone]

## 2016-04-27 NOTE — Progress Notes (Signed)
Physical Therapy Treatment Patient Details Name: Kylie Valencia MRN: [id number] DOB: 1956-11-01 Today's Date: 04/27/2016    History of Present Illness 60 year old female with history of COPD and stroke in 2/17 admitted to [facility] 6/15 by PCP with a chest x-ray showing a lung abnormality. CT showed a lower lobe cavitary lesion/pneumonia. Transferred to [name] 6/16 for further evaluation and possible bronchoscopy. At [facility], she was receiving Vancomycin and Aztreonam for pneumonia     PT Comments    Patient tolerated gait/stair training on RA with SpO2 grossly 87-89% and decreased to 83% upon arrival back to room. 2L O2 donned end of session and SpO2 elevated to 92% in ~2 mins. Supervision/min guard overall. Current plan remains appropriate.    Follow Up Recommendations  Home health PT;Supervision/Assistance - 24 hour     Equipment Recommendations  Other (comment) (Possibly home O2)    Recommendations for Other Services       Precautions / Restrictions Precautions Precautions: Fall Restrictions Weight Bearing Restrictions: No    Mobility  Bed Mobility Overal bed mobility: Independent                Transfers Overall transfer level: Independent                  Ambulation/Gait Ambulation/Gait assistance: Min guard Ambulation Distance (Feet): 300 Feet Assistive device: None Gait Pattern/deviations: Step-through pattern;Decreased stride length     General Gait Details: min guard for safety due to unsteadiness at times; no LOB; cues for cadence   Stairs Stairs: Yes Stairs assistance: Min guard Stair Management: One rail Left;Forwards Number of Stairs: 4 General stair comments: educated on energy conservation and sequencing  Wheelchair Mobility    Modified Rankin (Stroke Patients Only)       Balance     Sitting balance-Leahy Scale: Good       Standing balance-Leahy Scale: Fair               High level balance activites:  Head turns High Level Balance Comments: horizontal and vertical head turns; unsteadiness noted especially with vertical head turns    Cognition Arousal/Alertness: Awake/alert Behavior During Therapy: WFL for tasks assessed/performed Overall Cognitive Status: Within Functional Limits for tasks assessed                      Exercises      General Comments General comments (skin integrity, edema, etc.): SpO2 87-89% on RA with ambulation and stair training and decreased to 83% with ambulation back to room; 2L supplemental O2 donned in room and increased to 92% in less than 2 mins; pt educated on pursed lip breathing technique      Pertinent Vitals/Pain Pain Assessment: No/denies pain    Home Living                      Prior Function            PT Goals (current goals can now be found in the care plan section) Acute Rehab PT Goals Patient Stated Goal: to go home PT Goal Formulation: With patient Time For Goal Achievement: 05/09/16 Potential to Achieve Goals: Good Progress towards PT goals: Progressing toward goals    Frequency  Min 3X/week    PT Plan Current plan remains appropriate    Co-evaluation             End of Session Equipment Utilized During Treatment: Gait belt;Oxygen Activity Tolerance: Patient tolerated  treatment well Patient left: in bed;with call bell/phone within reach;with bed alarm set;with family/visitor present     Time: 3491-7915 PT Time Calculation (min) (ACUTE ONLY): 28 min  Charges:  $Gait Training: 8-22 mins $Therapeutic Activity: 8-22 mins                    G Codes:      [place] April, PTA Pager: [phone]   04/27/2016, 11:34 AM

## 2016-04-28 ENCOUNTER — Encounter (HOSPITAL_COMMUNITY): Payer: BLUE CROSS/BLUE SHIELD

## 2016-04-28 ENCOUNTER — Encounter (HOSPITAL_COMMUNITY)
Admission: AD | Disposition: A | Discharge status: Home / Self Care | Payer: Self-pay | Source: Other Acute Inpatient Hospital | Attending: Internal Medicine

## 2016-04-28 ENCOUNTER — Inpatient Hospital Stay (HOSPITAL_COMMUNITY): Payer: BLUE CROSS/BLUE SHIELD

## 2016-04-28 HISTORY — PX: VIDEO BRONCHOSCOPY: SHX5072

## 2016-04-28 LAB — RESPIRATORY PANEL BY PCR
ADENOVIRUS-RVPPCR: NOT DETECTED
Bordetella pertussis: NOT DETECTED
CORONAVIRUS 229E-RVPPCR: NOT DETECTED
CORONAVIRUS HKU1-RVPPCR: NOT DETECTED
CORONAVIRUS NL63-RVPPCR: NOT DETECTED
CORONAVIRUS OC43-RVPPCR: NOT DETECTED
Chlamydophila pneumoniae: NOT DETECTED
INFLUENZA A H1 2009-RVPPR: NOT DETECTED
INFLUENZA A H3-RVPPCR: NOT DETECTED
INFLUENZA B-RVPPCR: NOT DETECTED
Influenza A H1: NOT DETECTED
Influenza A: NOT DETECTED
MYCOPLASMA PNEUMONIAE-RVPPCR: NOT DETECTED
Metapneumovirus: NOT DETECTED
PARAINFLUENZA VIRUS 1-RVPPCR: NOT DETECTED
Parainfluenza Virus 2: NOT DETECTED
Parainfluenza Virus 3: NOT DETECTED
Parainfluenza Virus 4: NOT DETECTED
Respiratory Syncytial Virus: NOT DETECTED
Rhinovirus / Enterovirus: NOT DETECTED

## 2016-04-28 LAB — MPO/PR-3 (ANCA) ANTIBODIES: ANCA Proteinase 3: <3.5 U/mL (ref 0.0–3.5)

## 2016-04-28 LAB — EXPECTORATED SPUTUM ASSESSMENT W REFEX TO RESP CULTURE

## 2016-04-28 LAB — ANCA TITERS
C-ANCA: <1:20 {titer}
P-ANCA: <1:20 {titer}

## 2016-04-28 LAB — STREP PNEUMONIAE URINARY ANTIGEN: Strep Pneumo Urinary Antigen: NEGATIVE

## 2016-04-28 SURGERY — VIDEO BRONCHOSCOPY WITHOUT FLUORO
Anesthesia: Moderate Sedation | Laterality: Bilateral

## 2016-04-28 MED ORDER — LIDOCAINE HCL (PF) 1 % IJ SOLN
INTRAMUSCULAR | Status: DC | PRN
Start: 2016-04-28 — End: 2016-04-28
  Administered 2016-04-28: 6 mL

## 2016-04-28 MED ORDER — SODIUM CHLORIDE 0.9 % IV SOLN
INTRAVENOUS | Status: AC
  Administered 2016-04-28: 10:00:00 via INTRAVENOUS

## 2016-04-28 MED ORDER — FENTANYL CITRATE (PF) 100 MCG/2ML IJ SOLN
INTRAMUSCULAR | Status: AC
  Filled 2016-04-28: qty 2

## 2016-04-28 MED ORDER — PHENYLEPHRINE HCL 0.25 % NA SOLN
NASAL | Status: DC | PRN
  Administered 2016-04-28: 2 via NASAL

## 2016-04-28 MED ORDER — MIDAZOLAM HCL 10 MG/2ML IJ SOLN
INTRAMUSCULAR | Status: DC | PRN
Start: 2016-04-28 — End: 2016-04-28
  Administered 2016-04-28 (×4): 1 mg via INTRAVENOUS

## 2016-04-28 MED ORDER — MIDAZOLAM HCL 5 MG/ML IJ SOLN
INTRAMUSCULAR | Status: AC
  Filled 2016-04-28: qty 2

## 2016-04-28 MED ORDER — LIDOCAINE HCL 2 % EX GEL
CUTANEOUS | Status: DC | PRN
  Administered 2016-04-28: 1

## 2016-04-28 MED ORDER — FENTANYL CITRATE (PF) 100 MCG/2ML IJ SOLN
INTRAMUSCULAR | Status: AC
  Filled 2016-04-28: qty 4

## 2016-04-28 MED ORDER — FENTANYL CITRATE (PF) 100 MCG/2ML IJ SOLN
INTRAMUSCULAR | Status: DC | PRN
  Administered 2016-04-28 (×4): 50 ug via INTRAVENOUS

## 2016-04-28 NOTE — Progress Notes (Signed)
Video Bronchoscopy Done  Intervention Bronchial washing Intervention Bronchial brushing  Rush Farmer, M.D. [facility] Pulmonary/Critical Care Medicine. Pager: [phone]. After hours pager: [phone].

## 2016-04-28 NOTE — Care Management Note (Signed)
Case Management Note  Patient Details  Name: [name] MRN: [id number] Date of Birth: 13-Jan-1956  Subjective/Objective:                 Patient admitted from home for cavetary PNA. Will have bronch today, Pulm/ ID consult to await results of BAL Cx from bronch. Patient is on 2L O2, non dependent prior to admission   Action/Plan:  Anticipate HH PT, and possibly new home O2 and HH RN. CM will continue to follow.   Expected Discharge Date:                  Expected Discharge Plan:  [place]  In-House Referral:     Discharge planning Services  CM Consult  Post Acute Care Choice:    Choice offered to:     DME Arranged:    DME Agency:     HH Arranged:    [place] Agency:     Status of Service:  In process, will continue to follow  Medicare Important Message Given:    Date Medicare IM Given:    Medicare IM give by:    Date Additional Medicare IM Given:    Additional Medicare Important Message give by:     If discussed at [place] of Stay Meetings, dates discussed:    Additional Comments:  Carles Collet, RN 04/28/2016, 11:13 AM

## 2016-04-28 NOTE — Progress Notes (Signed)
PULMONARY CONSULT   Name: Kylie Valencia MRN: [id number] DOB: 05-08-1956    ADMISSION DATE:  04/24/2016 CONSULTATION DATE:  6/16  REFERRING MD :    CHIEF COMPLAINT:  Cavitary PNA   BRIEF PATIENT DESCRIPTION:  60 year old female initially admitted to [facility] w/ CC: shortness of breath and cough. Has PMH sig for ETOH abuse, COPD, depression and most recently a CVA back in Feb 2017. Since d/c she has had multiple rounds of abx for recurrent PNAs. Most recently she had gone to see her PCP on 6/15 w/ cc cough and SOB. She was placed on Levaquin and CXR was obtained. This raised concern for cavitation in the LLL so she was referred to the ER for further eval. Further eval in the ER consisted of CT chest which showed : LLL lung lesion w/ associated lymphadenopathy. [facility] evaluation.   ->ETOH daily -> dental caries  -> recent stroke  SIGNIFICANT EVENTS    STUDIES:  CT chest 6/15: cavitary LLL lung lesion w/ associated infiltrate. Increased hilar adenopathy   SUBJECTIVE:  No events overnight, feels ok.  VITAL SIGNS: Temp:  [98.3 F (36.8 C)-99.5 F (37.5 C)] 98.3 F (36.8 C) (06/20 1245) Pulse Rate:  [80-97] 82 (06/20 1350) Resp:  [7-20] 7 (06/20 1350) BP: (123-180)/(61-82) 123/74 mmHg (06/20 1350) SpO2:  [89 %-97 %] 89 % (06/20 1350) Weight:  [51.256 kg (113 lb)] 51.256 kg (113 lb) (06/20 1245)  PHYSICAL EXAMINATION: General:  Awake, alert, no distress.  Neuro:  Awake, alert, no focal def HEENT:  [place], has partials upper and lower. [place]/AT, PERRL, EOM-I and MMM. Cardiovascular:  RRR w/out MRG Lungs:  Decreased BS at the left lung posteriorly but otherwise clear. Abdomen:  Soft, not tender + bowel sounds  Musculoskeletal:  Equal st and bulk  Skin:  Warm and dry    Recent Labs Lab 04/25/16 1216 04/26/16 0334 04/27/16 0637  NA 133* 133* 134*  K 3.4* 3.1* 3.5  CL 101 101 100*  CO2 '26 24 27  '$ BUN 5* 5* <5*  CREATININE  0.43* 0.38* 0.35*  GLUCOSE 98 86 96   ASSESSMENT / PLAN:  60 year old female with known cavitation noted on CT on February of this year who presents with a pneumonia and multiple pulmonary nodules. On exam, crackles bibasilarly and rhonchi L>R. I reviewed chest CT myself, cavitation with debris noted, surrounding infiltrate and multiple pulmonary nodules. Discussed with PCCM-NP.  Pulmonary nodules/?mass: differential very wide. - Perform bronchoscopy today with brush, BAL and biopsy as able. - Inspect left airway and evaluate for obstruction.     - May begin diet after recovery.  Cavitary pneumonia/lesion: etoh abuse so likely progressive aspiration and adding a stroke makes chronic aspiration very likely with cavitary pneumonia but can not r/o post obstructive with smoking history. - Flagyl/aztreonam/vanc until BAL cultures result. - F/U on cultures. - Would call ID to make recommendations on long term abx. - F/U on bronch culture.  Hypoxemia: - Titrate O2 for sat of 88-92%. - Ambulatory desat prior to discharge for home O2.  COPD: - PRN albuterol.    - ICS. - Will need PFT's once fully treated for PNA.  Patient seen and examined, agree with above note. I dictated the care and orders written for this patient under my direction.  Rush Farmer, MD [phone]

## 2016-04-28 NOTE — Progress Notes (Signed)
Patient NPO at midnight for Bronchoscopy later today.  On-call NP, Schorr notified about PO Flagyl 500 mg scheduled at 0600; RN asked if it was to be held or given.  NP Schorr called back and said that she would look through the chart and assess if this could be given.  Awaiting NP orders.  Will continue to monitor patient.

## 2016-04-28 NOTE — Progress Notes (Signed)
Kylie Valencia for Infectious Disease    Date of Admission:  04/24/2016    ID: Kylie Valencia is a 60 y.o. female with a LLL cavitary lesion.   Principal Problem:   Cavitary pneumonia Active Problems:   Brain aneurysm   Hyperlipidemia   Depression   Peripheral vascular disease (HCC)   COPD (chronic obstructive pulmonary disease) (HCC)   Stroke (HCC)   Tobacco abuse   Pneumonia   Protein-calorie malnutrition-moderate   Solitary pulmonary nodule   Lung mass   Hypoxemia  Subjective: Kylie Valencia is a 60 F with PMHx of alcohol abuse, tobacco abuse, HLD, COPD, PVD, HLD, CVA, Depression, and brain aneurysm who presented from [facility] with complaints of cough and shortness of breath and was found to have a LLL cavitary lesion.  Patient was seen and examined this morning. She continues to have a productive cough with hemoptysis. Shortness of breath is improved. She continues to have loose stools, but denies any fever or chills or abdominal pain. Plans are for bronchoscopy this afternoon.    Medications:  . [MAR Hold] aspirin EC  81 mg Oral Daily  . [MAR Hold] atorvastatin  10 mg Oral QHS  . [MAR Hold] aztreonam  1 g Intravenous Q8H  . [MAR Hold] citalopram  20 mg Oral QHS  . [MAR Hold] citalopram  40 mg Oral q morning - 10a  . [MAR Hold] clopidogrel  75 mg Oral Daily  . [MAR Hold] dextromethorphan-guaiFENesin  1 tablet Oral BID  . [MAR Hold] enoxaparin (LOVENOX) injection  40 mg Subcutaneous Q24H  . [MAR Hold] feeding supplement (ENSURE ENLIVE)  237 mL Oral BID BM  . [MAR Hold] folic acid  1 mg Oral Daily  . LORazepam  0-4 mg Intravenous Q12H  . [MAR Hold] metroNIDAZOLE  500 mg Oral Q8H  . [MAR Hold] multivitamin with minerals  1 tablet Oral Daily  . [MAR Hold] nicotine  21 mg Transdermal Daily  . [MAR Hold] thiamine  100 mg Oral Daily  . [MAR Hold] vancomycin  1,000 mg Intravenous Q12H    Objective: Filed Vitals:   04/28/16 1305 04/28/16 1310 04/28/16  1315 04/28/16 1320  BP: 159/81 163/78 160/82 150/72  Pulse: 81 80 81 85  Temp:      TempSrc:      Resp: '14 16 16 14  '$ Height:      Weight:      SpO2: 97% 96% 94% 95%   General: Vital signs reviewed.  Patient is thin appearing, in no acute distress and cooperative with exam.  Cardiovascular: RRR, S1 normal, S2 normal, no murmurs, gallops, or rubs. Pulmonary/Chest: Decreased breath sounds in left lower lung field with mild inspiratory crackles, no wheezes, or rhonchi. Abdominal: Soft, non-tender, non-distended, BS + Extremities: No lower extremity edema bilaterally Skin: Warm, dry and intact.   Lab Results  Recent Labs  04/26/16 0334 04/27/16 0637  WBC 13.9* 13.3*  HGB 11.2* 12.5  HCT 34.3* 38.6  NA 133* 134*  K 3.1* 3.5  CL 101 100*  CO2 24 27  BUN 5* <5*  CREATININE 0.38* 0.35*   Micro: BCx 6/16>> NGTD x 2 C Diff>> Negative Sputum Cx pending Respiratory Panel PCR pending  Antibiotics: Aztreonam 6/16>> Flagyl 6/16>> Vancomycin 6/16>>  Assessment/Plan: Kylie Valencia is a 60 F with PMHx of alcohol abuse, tobacco abuse, HLD, COPD, PVD, HLD, CVA, Depression, and brain aneurysm who presented from [facility] with complaints of cough and shortness  of breath and found to have a cavitary LLL lesion with associated infiltrate and hilar adenopathy.  Cavitary Lung Lesion: Patient has had recurrent shortness of breath and cough since February 2017. CTA chest showed a cavitary LLL lesions with associated infiltrate and hilar adenopathy. Differential is broad, but likely bacterial. Unlikely to be secondary to TB given no known risk factors.  -Plans for bronchoscopy this afternoon -Follow up cultures -Continue aztreonam, vancomycin, and flagyl -Send specimen for aerobic, fungal and afb cultures (no need for isolation as no TB risk factors) -ANCA, MPO, step pneumo, and legionella pending  Tobacco and Alcohol abuse: -On nicotine patch -On CIWA -Avoid alcohol while on  Flagyl  COPD: Requiring oxygen via nasal cannula, but not in an acute exacerbation. -Albuterol prn -Supplemental O2  Martyn Malay, DO PGY-2 Internal Medicine Resident Pager # [phone] 04/28/2016 1:33 PM

## 2016-04-28 NOTE — Procedures (Signed)
Bronchoscopy Procedure Note Kylie Valencia [id number] 21-Feb-1956  Procedure: Bronchoscopy Indications: Diagnostic evaluation of the airways and Obtain specimens for culture and/or other diagnostic studies  Procedure Details Consent: Risks of procedure as well as the alternatives and risks of each were explained to the (patient/caregiver).  Consent for procedure obtained. Time Out: Verified patient identification, verified procedure, site/side was marked, verified correct patient position, special equipment/implants available, medications/allergies/relevent history reviewed, required imaging and test results available.  Performed  In preparation for procedure, bronchoscope lubricated. Sedation: Benzodiazepines and Fentanyl  Complete external compression of the LLL posterior segment noted and was unable to get into segment.  Brush was advanced into the space however and brushing was performed.  BAL from posterior segment and lateral segments of the LLL.  Airway entered and the following bronchi were examined: RUL, RML, RLL, LUL, LLL and Bronchi.   Procedures performed: Brushings performed at the LLL. Bronchoscope removed.    Evaluation Hemodynamic Status: BP stable throughout; O2 sats: stable throughout Patient's Current Condition: stable Specimens:  Sent purulent fluid Complications: No apparent complications Patient did tolerate procedure well.   Jennet Maduro 04/28/2016

## 2016-04-28 NOTE — Progress Notes (Signed)
PROGRESS NOTE                                                                                                                                                                                                             Patient Demographics:    Kylie Valencia, is a 60 y.o. female, DOB - 17-Apr-1956, LSL:[id number]  Admit date - 04/24/2016   Admitting Physician Kylie Costa, MD  Outpatient Primary MD for the patient is Kylie Cousin, MD  LOS - 4   No chief complaint on file.      Brief Narrative   60 year old femaleWith past medical history of COPD, alcohol abuse, depression, CVA,  admitted initially trend of hospital for dyspnea and cough, she had multiple rounds of antibiotics for recurrent pneumonia treatment, CT chest significant for left lower lung lesion with associated lymphadenopathy especially is for cavitary pneumonia, admitted for further management, and by [facility] a.m., plan for bronchoscopy today   Subjective:    Kylie Valencia today has, No headache, No chest pain, No abdominal pain , Complains of cough   Assessment  & Plan :    Principal Problem:   Cavitary pneumonia Active Problems:   Brain aneurysm   Hyperlipidemia   Depression   Peripheral vascular disease (HCC)   COPD (chronic obstructive pulmonary disease) (HCC)   Stroke ([place])   Tobacco abuse   Pneumonia   Protein-calorie malnutrition-moderate   Solitary pulmonary nodule   Lung mass   Hypoxemia  1. Cavitary pneumonia: As showed by CT angiogram. This is concerning for lung abscess. She has low risk of TB. ABG showed pH of 7.48, PCO2 36, PO2 47. -cont IV Vancomycin and Aztreonam and Flagyl, cont, ID consulted regarding long-term enteric management - PCCM., planed for bronchoscopy today. Check anca  2. COPD (chronic obstructive pulmonary disease) (HCC) No signs of acute exacerbation. No wheezing or rhonchi on auscultation. Cont as necessary albuterol nebulizer  3. Sepsis: pt meets  criteria for sepsis with leukocytosis, HR>90 and RR=20. Pending lactate level. Hemodynamically stable currently. -cont IV atx, blood cultures: NGTD  4. HLD: Last LDL was not on record. Continue home medications: Lipitor  5. Depression: Stable, no suicidal or homicidal ideations. Continue home medications: Celexa  6. History of stroke: continue lipitor, Plavix and ASA  7. Tobacco abuse and Alcohol abuse: Did counseling about importance of quitting  smoking. Cont nicotine patch -Did counseling about the importance of quitting drinking CIWA protocol  8. Hypokalemia:  Repleted   Code Status : Full  Family Communication  : none at bedside  Disposition Plan  : Home, pend work up  Consults  :  PCCM, ID  Procedures  : none  DVT Prophylaxis  :  Lovenox  Lab Results  Component Value Date   PLT 327 04/27/2016    Antibiotics  :    Anti-infectives    Start     Dose/Rate Route Frequency Ordered Stop   04/27/16 2000  metroNIDAZOLE (FLAGYL) tablet 500 mg     500 mg Oral Every 8 hours 04/27/16 1548     04/26/16 1800  vancomycin (VANCOCIN) IVPB 1000 mg/200 mL premix     1,000 mg 200 mL/hr over 60 Minutes Intravenous Every 12 hours 04/26/16 1021     04/24/16 2200  vancomycin (VANCOCIN) 500 mg in sodium chloride 0.9 % 100 mL IVPB  Status:  Discontinued     500 mg 100 mL/hr over 60 Minutes Intravenous Every 12 hours 04/24/16 2039 04/26/16 1021   04/24/16 2200  aztreonam (AZACTAM) 1 g in dextrose 5 % 50 mL IVPB     1 g 100 mL/hr over 30 Minutes Intravenous Every 8 hours 04/24/16 2039     04/24/16 2015  metroNIDAZOLE (FLAGYL) IVPB 500 mg  Status:  Discontinued     500 mg 100 mL/hr over 60 Minutes Intravenous Every 8 hours 04/24/16 2007 04/27/16 1548        Objective:   Filed Vitals:   04/27/16 0545 04/27/16 1418 04/27/16 2136 04/28/16 0527  BP: 164/83 151/81 143/77 147/72  Pulse: 96 87 83 97  Temp: 99.1 F (37.3 C) 98.9 F (37.2 C) 98.7 F (37.1 C) 99.5 F (37.5 C)  TempSrc:  Oral Oral Oral Oral  Resp: '18 16 18 20  '$ Weight:      SpO2: 97% 91% 97% 95%    Wt Readings from Last 3 Encounters:  04/24/16 51.5 kg (113 lb 8.6 oz)  03/16/16 51.5 kg (113 lb 8.6 oz)  02/18/16 48.081 kg (106 lb)     Intake/Output Summary (Last 24 hours) at 04/28/16 1037 Last data filed at 04/28/16 0957  Gross per 24 hour  Intake 2341.67 ml  Output   3100 ml  Net -758.33 ml     Physical Exam    General: No diaphoresis, anxious, no acute distress  Cardiovascular: Regular rate and rhythm no murmurs rubs or gallops  Respiratory: Clear to auscultation bilaterally & incr wob  Abdomen: Nondistended bowel sounds normal nontender palpation Musculoskeletal: Moving all extremities, no deformity, 5 out of 5 strength   Data Review:    CBC  Recent Labs Lab 04/25/16 1216 04/26/16 0334 04/27/16 0637  WBC 18.0* 13.9* 13.3*  HGB 11.2* 11.2* 12.5  HCT 33.6* 34.3* 38.6  PLT 282 303 327  MCV 96.6 97.2 98.2  MCH 32.2 31.7 31.8  MCHC 33.3 32.7 32.4  RDW 13.3 13.5 13.5  LYMPHSABS 1.0 1.2  --   MONOABS 1.6* 1.1*  --   EOSABS 0.7 1.6*  --   BASOSABS 0.0 0.0  --     Chemistries   Recent Labs Lab 04/24/16 2036 04/25/16 1216 04/26/16 0334 04/27/16 0637  NA 130* 133* 133* 134*  K 3.2* 3.4* 3.1* 3.5  CL 96* 101 101 100*  CO2 '24 26 24 27  '$ GLUCOSE 150* 98 86 96  BUN <5* 5* 5* <5*  CREATININE  0.39* 0.43* 0.38* 0.35*  CALCIUM 8.6* 8.4* 8.0* 8.0*  MG 1.5* 2.0 1.7  --    ------------------------------------------------------------------------------------------------------------------ No results for input(s): CHOL, HDL, LDLCALC, TRIG, CHOLHDL, LDLDIRECT in the last 72 hours.  No results found for: HGBA1C ------------------------------------------------------------------------------------------------------------------ No results for input(s): TSH, T4TOTAL, T3FREE, THYROIDAB in the last 72 hours.  Invalid input(s):  FREET3 ------------------------------------------------------------------------------------------------------------------ No results for input(s): VITAMINB12, FOLATE, FERRITIN, TIBC, IRON, RETICCTPCT in the last 72 hours.  Coagulation profile  Recent Labs Lab 04/24/16 2036  INR 0.98    No results for input(s): DDIMER in the last 72 hours.  Cardiac Enzymes No results for input(s): CKMB, TROPONINI, MYOGLOBIN in the last 168 hours.  Invalid input(s): CK ------------------------------------------------------------------------------------------------------------------    Component Value Date/Time   BNP 129.1* 04/25/2016 0500    Inpatient Medications  Scheduled Meds: . aspirin EC  81 mg Oral Daily  . atorvastatin  10 mg Oral QHS  . aztreonam  1 g Intravenous Q8H  . citalopram  20 mg Oral QHS  . citalopram  40 mg Oral q Valencia - 10a  . clopidogrel  75 mg Oral Daily  . dextromethorphan-guaiFENesin  1 tablet Oral BID  . enoxaparin (LOVENOX) injection  40 mg Subcutaneous Q24H  . feeding supplement (ENSURE ENLIVE)  237 mL Oral BID BM  . folic acid  1 mg Oral Daily  . LORazepam  0-4 mg Intravenous Q12H  . metroNIDAZOLE  500 mg Oral Q8H  . multivitamin with minerals  1 tablet Oral Daily  . nicotine  21 mg Transdermal Daily  . thiamine  100 mg Oral Daily  . vancomycin  1,000 mg Intravenous Q12H   Continuous Infusions: . sodium chloride 100 mL/hr at 04/28/16 0006   PRN Meds:.albuterol  Micro Results Recent Results (from the past 240 hour(s))  Culture, blood (x 2)     Status: None (Preliminary result)   Collection Time: 04/24/16  8:36 PM  Result Value Ref Range Status   Specimen Description BLOOD RIGHT ANTECUBITAL  Final   Special Requests BOTTLES DRAWN AEROBIC ONLY 5CC  Final   Culture NO GROWTH 3 DAYS  Final   Report Status PENDING  Incomplete  Culture, blood (x 2)     Status: None (Preliminary result)   Collection Time: 04/24/16  8:39 PM  Result Value Ref Range Status    Specimen Description BLOOD LEFT HAND  Final   Special Requests BOTTLES DRAWN AEROBIC AND ANAEROBIC 5CC  Final   Culture NO GROWTH 3 DAYS  Final   Report Status PENDING  Incomplete  C difficile quick scan w PCR reflex     Status: None   Collection Time: 04/25/16 11:09 AM  Result Value Ref Range Status   C Diff antigen NEGATIVE NEGATIVE Final   C Diff toxin NEGATIVE NEGATIVE Final   C Diff interpretation Negative for toxigenic C. difficile  Final    Radiology Reports Ir Radiologist Eval & Mgmt  04/21/2016  EXAM: ESTABLISHED PATIENT OFFICE VISIT CHIEF COMPLAINT: Chest and cold infection.  Pain with passing urine. She denies recent chills, fever or rigors. 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 The patient had an uneventful procedure. The patient did develop some symptoms of breathing difficulties which responded to 2 albuterol inhalation treatments. The patient was then discharged and had a chest x-ray which revealed the presence of  a left lower lobe consolidation probably inflammatory. The patient was then treated with antibiotics for about 7 days with improvement. However, she was then started on a short course of prednisone which significantly improved her breathing difficulties and improved her appetite. However, since she stopped taking the prednisone, the patient has developed continuous diarrhea which is watery and without any noticeable reddish discoloration. These are associated mild tummy aches. Her appetite is decreased and her weight loss is related to her lack of appetite and diarrhea. She continues to have a mild cough occasionally associated with phlegm production. However, she denies any hemoptysis. She also has symptoms of urgency of micturition, however, she predates that to her  procedure and has been experiencing this for about 3-4 months. She denies any hematuria or dysuria. She has noticed, however, easy bruisability in her skin on her forearms and also her thighs. Her past medical history, previous surgical history, social history, family history as per her H and P for from 3 weeks ago, unchanged. Present medications: Aspirin 81 mg a day. Plavix 75 mg a day. Atorvastatin. Celexa. Combivent. Vitamin-D, Ergocalciferol. Allergies: Penicillins which causes anaphylaxis and shortness of breath. PHYSICAL EXAMINATION: In no acute distress. However, appears pale and thin. Affect normal. Neurological fully intact. ASSESSMENT AND PLAN: The patient's immediate post treatment arteriograms were reviewed with her and her friend. These depict placement of a pipeline flow diverter device with stasis within the endovascularly treated aneurysm involving the left internal carotid artery intracranially. Also noted was a chest x-ray performed which depicted the presence of a left lower lobe consolidation probably inflammatory. The plan today is to obtain a platelet inhibition study P2Y12. Additionally the patient has been advised to revisit with her primary care doctor for consideration of repeat chest x-ray if warranted. Also the patient has been advised to drink plenty of liquids because of her diarrhea and to review her symptoms of diarrhea with her primary care physician as quickly as possible. 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 Questions were answered to her satisfaction. The patient was asked to call should she have any concerns or questions. Electronically Signed   By: Luanne Bras M.D.   On: 04/17/2016 12:25    Time Spent in minutes  25 minutes   Rowe Clack N M.D on 04/28/2016 at  10:37 AM  Between 7am to 7pm - Pager - [phone]  After 7pm go to [email or web address] - password [facility]  [facility] -  Office  [phone]

## 2016-04-29 ENCOUNTER — Inpatient Hospital Stay (HOSPITAL_COMMUNITY): Payer: BLUE CROSS/BLUE SHIELD

## 2016-04-29 ENCOUNTER — Encounter (HOSPITAL_COMMUNITY): Payer: Self-pay | Admitting: Pulmonary Disease

## 2016-04-29 DIAGNOSIS — Z9889 Other specified postprocedural states: Secondary | ICD-10-CM

## 2016-04-29 LAB — VANCOMYCIN, TROUGH: VANCOMYCIN TR: 11 ug/mL (ref 10.0–20.0)

## 2016-04-29 LAB — BASIC METABOLIC PANEL
ANION GAP: 7 (ref 5–15)
CALCIUM: 8.2 mg/dL — AB (ref 8.9–10.3)
CO2: 29 mmol/L (ref 22–32)
CREATININE: 0.43 mg/dL — AB (ref 0.44–1.00)
Chloride: 95 mmol/L — ABNORMAL LOW (ref 101–111)
Glucose, Bld: 92 mg/dL (ref 65–99)
POTASSIUM: 3.5 mmol/L (ref 3.5–5.1)
Sodium: 131 mmol/L — ABNORMAL LOW (ref 135–145)

## 2016-04-29 LAB — CULTURE, BLOOD (ROUTINE X 2)
Culture: NO GROWTH
Culture: NO GROWTH

## 2016-04-29 LAB — APTT: aPTT: <20 seconds — ABNORMAL LOW (ref 24–37)

## 2016-04-29 LAB — ACID FAST SMEAR (AFB)
ACID FAST SMEAR - AFSCU2: NEGATIVE
ACID FAST SMEAR - AFSCU2: NEGATIVE

## 2016-04-29 LAB — ACID FAST SMEAR (AFB, MYCOBACTERIA)

## 2016-04-29 LAB — MAGNESIUM: Magnesium: 1.7 mg/dL (ref 1.7–2.4)

## 2016-04-29 MED ORDER — VANCOMYCIN HCL 10 G IV SOLR
1250.0000 mg | Freq: Two times a day (BID) | INTRAVENOUS | Status: DC
  Administered 2016-04-30: 1250 mg via INTRAVENOUS
  Filled 2016-04-29 (×3): qty 1250

## 2016-04-29 MED ORDER — IOPAMIDOL (ISOVUE-300) INJECTION 61%
INTRAVENOUS | Status: AC
  Administered 2016-04-29: 100 mL
  Filled 2016-04-29: qty 100

## 2016-04-29 MED ORDER — DIATRIZOATE MEGLUMINE & SODIUM 66-10 % PO SOLN
15.0000 mL | ORAL | Status: AC
  Administered 2016-04-29 (×2): 15 mL via ORAL
  Filled 2016-04-29: qty 30

## 2016-04-29 NOTE — Progress Notes (Addendum)
Patient had a run of SVT > 280. Patient was at the sink brushing teeth. Asymptomatic and the SVT was nonsustained. Dr. Verline Lema and will continue to monitor.

## 2016-04-29 NOTE — Progress Notes (Signed)
Pharmacy Antibiotic Note Kylie Valencia is a 60 y.o. female admitted to [facility] 6/15 by PCP with a chest x-ray showing a lung abnormality.  CT showed a lower lobe cavitary lesion/pneumonia and is now s/p bronchoscopy on 6/20. Currently on day 5 of therapy with Vancomycin + Azactam + Flagyl for empiric cavitary PNA coverage.    Goal of Therapy: Vancomycin trough of 15-20 mcg/ml  Plan: 1. Continue Azactam 2g IV every 8 hours 2. Continue Flagyl 500 mg IV every 8 hours 3. Increase Vancomycin to 1250 mg iv Q 12 hours (with trough of 11)    Height: '5\' 4"'$  (162.6 cm) Weight: 113 lb (51.256 kg) IBW/kg (Calculated) : 54.7  Temp (24hrs), Avg:98.4 F (36.9 C), Min:98.2 F (36.8 C), Max:98.7 F (37.1 C)   Recent Labs Lab 04/24/16 2036 04/24/16 2354 04/25/16 1216 04/26/16 0334 04/26/16 0908 04/27/16 0637 04/29/16 0551 04/29/16 1659  WBC  --   --  18.0* 13.9*  --  13.3*  --   --   CREATININE 0.39*  --  0.43* 0.38*  --  0.35* 0.43*  --   LATICACIDVEN 1.3 1.0  --   --   --   --   --   --   VANCOTROUGH  --   --   --   --  7*  --   --  11    Estimated Creatinine Clearance: 59.8 mL/min (by C-G formula based on Cr of 0.43).    Allergies  Allergen Reactions  . Penicillins Anaphylaxis, Shortness Of Breath and Other (See Comments)    Has patient had a PCN reaction causing immediate rash, facial/tongue/throat swelling, SOB or lightheadedness with hypotension: Yes Has patient had a PCN reaction causing severe rash involving mucus membranes or skin necrosis: No Has patient had a PCN reaction that required hospitalization Yes Has patient had a PCN reaction occurring within the last 10 years: No If all of the above answers are "NO", then may proceed with Cephalosporin use.     Antimicrobials this admission: Vanc 6/16 >> Azactam 6/16 >> Flagyl 6/16 >>  Dose adjustments this admission: * 6/18 VT 7 mcg/ml (1 hr early) >> adjust to 1g/12h * 6/21 VT 11 mcg / ml >> adjust to 1250  mg/12hr  Microbiology results: 6/20 bronchoscopy: px 6/16 BCx >> ngtd 6/16 RCx >> negative  6/16 CDiff >> negative   Thank you Anette Guarneri, PharmD [phone] 04/29/2016, 6:33 PM

## 2016-04-29 NOTE — Progress Notes (Signed)
Nutrition Follow-up  DOCUMENTATION CODES:   Not applicable  INTERVENTION:   -Continue to encourage adequate PO intake -Continue MVI daily  NUTRITION DIAGNOSIS:   Increased nutrient needs related to acute illness as evidenced by estimated needs.  Progressing  GOAL:   Patient will meet greater than or equal to 90% of their needs  Progressing  MONITOR:   PO intake, Diet advancement, Labs  REASON FOR ASSESSMENT:   Malnutrition Screening Tool    ASSESSMENT:   Kylie Valencia is a 60 y.o. female with medical history significant of alcohol abuse, tobacco abuse, hyperlipidemia, COPD, depression, PVD (s/p of Aorto-femoral bypass graft), stroke, brain aneurysm, who presents with cough and shortness of breath.  Pt underwent bronchoscopy on 04/28/16; awaiting culture results.   Pt in good spirits today, sitting in recliner and reading a book at time of visit. She reports her appetite is slowly improving. She shares that she ate all of her breakfast this morning as she was "starving" because her dinner was held last night due to bronchoscopy. She reveals she "is not a big eater" at baseline, but intake is comparable to home. Meal completion 40-60%.   Pt reports she consumes mainly fresh fruit, vegetables, and gets most of her protein via peanut butter. 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 Offered pt snacks in between meals to optimize intake, but pt politely declined.   Labs reviewed: Na: 131.   Diet Order:  Diet heart healthy/carb modified Room service appropriate?: Yes; Fluid consistency:: Thin  Skin:  Reviewed, no issues  Last BM:  04/29/16  Height:   Ht Readings from Last 1 Encounters:  04/28/16 '5\' 4"'$  (1.626 m)    Weight:   Wt Readings from Last 1 Encounters:  04/28/16 113 lb (51.256 kg)    Ideal Body Weight:  54.54 kg  BMI:  Body mass index is 19.39  kg/(m^2).  Estimated Nutritional Needs:   Kcal:  1600-1800 (32-36 kcal/kg bw)  Protein:  65-75 g (1.3-1.5 g/kg bw)  Fluid:  >1800 mls fluid  EDUCATION NEEDS:   No education needs identified at this time  Kemiyah Tarazon A. Jimmye Norman, RD, LDN, CDE Pager: [phone] After hours Pager: [phone]

## 2016-04-29 NOTE — Progress Notes (Signed)
PULMONARY CONSULT   Name: Kylie Valencia MRN: [id number] DOB: Dec 30, 1955    ADMISSION DATE:  04/24/2016 CONSULTATION DATE:  6/16  REFERRING MD :    CHIEF COMPLAINT:  Cavitary PNA   BRIEF PATIENT DESCRIPTION:  60 year old female initially admitted to [place] hospital w/ CC: shortness of breath and cough. Has PMH sig for ETOH abuse, COPD, depression and most recently a CVA back in Feb 2017. Since d/c she has had multiple rounds of abx for recurrent PNAs. Most recently she had gone to see her PCP on 6/15 w/ cc cough and SOB. She was placed on Levaquin and CXR was obtained. This raised concern for cavitation in the LLL so she was referred to the ER for further eval. Further eval in the ER consisted of CT chest which showed : LLL lung lesion w/ associated lymphadenopathy. [facility] was transported [facility] diagnostic evaluation.   -> ETOH daily -> dental caries  -> recent stroke  SIGNIFICANT EVENTS    STUDIES:  CT chest 6/15: cavitary LLL lung lesion w/ associated infiltrate. Increased hilar adenopathy   SUBJECTIVE:  No events overnight, feels ok.  VITAL SIGNS: Temp:  [98.1 F (36.7 C)-98.7 F (37.1 C)] 98.2 F (36.8 C) (06/21 0514) Pulse Rate:  [71-113] 71 (06/21 0514) Resp:  [7-18] 18 (06/21 0514) BP: (123-203)/(58-175) 126/72 mmHg (06/21 0514) SpO2:  [89 %-98 %] 98 % (06/21 0514)  PHYSICAL EXAMINATION: General:  Awake, alert, no distress.  Neuro:  Awake, alert, no focal def HEENT:  [place], has partials upper and lower. [place]/AT, PERRL, EOM-I and MMM. Cardiovascular:  RRR w/out MRG Lungs:  Decreased BS at the left lung posteriorly but otherwise clear. Abdomen:  Soft, not tender + bowel sounds  Musculoskeletal:  Equal st and bulk  Skin:  Warm and dry    Recent Labs Lab 04/26/16 0334 04/27/16 0637 04/29/16 0551  NA 133* 134* 131*  K 3.1* 3.5 3.5  CL 101 100* 95*  CO2 '24 27 29  '$ BUN 5* <5* <5*  CREATININE 0.38* 0.35* 0.43*  GLUCOSE 86 96 92   ASSESSMENT /  PLAN:  60 year old female with known cavitation noted on CT on February of this year who presents with a pneumonia and multiple pulmonary nodules. On exam, crackles bibasilarly and rhonchi L>R. I reviewed chest CT myself, cavitation with debris noted, surrounding infiltrate and multiple pulmonary nodules. Discussed with PCCM-NP.  Pulmonary nodules/?mass: differential very wide. - Bronch done, no endobronchial lesion noted, brush and BAL done, results pending, will f/u. - Inspect left airway and evaluate for external obstruction.  Cavitary pneumonia/lesion: etoh abuse so likely progressive aspiration and adding a stroke makes chronic aspiration very likely with cavitary pneumonia but can not r/o post obstructive with smoking history. - Flagyl/aztreonam/vanc until BAL cultures result. - F/U on cultures. - Would call ID to make recommendations on long term abx. - F/U on bronch culture.  Hypoxemia: - Titrate O2 for sat of 88-92%. - Ambulatory desat prior to discharge for home O2.  COPD: - PRN albuterol.    - ICS. - Will need PFT's once fully treated for PNA.  Patient seen and examined, agree with above note. I dictated the care and orders written for this patient under my direction.  Rush Farmer, MD [phone]

## 2016-04-29 NOTE — Progress Notes (Signed)
PROGRESS NOTE                                                                                                                                                                                                             Patient Demographics:    Kylie Valencia, is a 60 y.o. female, DOB - 01-28-1956, HGD:[id number]  Admit date - 04/24/2016   Admitting Physician Ivor Costa, MD  Outpatient Primary MD for the patient is Charletta Cousin, MD  LOS - 5   No chief complaint on file.      Brief Narrative   60 year old femaleWith past medical history of COPD, alcohol abuse, depression, CVA,  admitted initially trend of hospital for dyspnea and cough, she had multiple rounds of antibiotics for recurrent pneumonia treatment, CT chest significant for left lower lung lesion with associated lymphadenopathy especially is for cavitary pneumonia, admitted for further management. 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    Subjective:    Gracieann Stannard today continues to require oxygen . Denies any new complaints.   Assessment  & Plan :    Principal Problem:   Cavitary pneumonia Active Problems:   Brain aneurysm   Hyperlipidemia   Depression   Peripheral vascular disease (HCC)   COPD (chronic obstructive pulmonary disease) (HCC)   Stroke (HCC)   Tobacco abuse   Pneumonia   Protein-calorie malnutrition-moderate   Solitary pulmonary nodule   Lung mass   Hypoxemia Acute respiratory failure possibly from the Cavitary pneumonia: As showed by CT angiogram. Started on broad spectrum antibiotics.  -cont IV Vancomycin and Aztreonam and Flagyl, cont, ID consulted regarding long-term enteric management. 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  CT abd andpelvis ordered to evaluate for  metastases.  Oncology Dr Alvy Bimler consulted for further recommendations.    2. COPD (chronic obstructive pulmonary disease) (HCC) No signs of acute exacerbation. No wheezing or rhonchi on auscultation. Cont as necessary albuterol nebulizer  3. Sepsis: pt meets criteria for sepsis with leukocytosis, HR>90 and RR=20.  Much improved. Resume broad spectrum antibiotics.   4. HLD: Last LDL was not on record. Continue home medications: Lipitor  5. Depression: Stable, no suicidal or homicidal ideations. Continue home  medications: Celexa  6. History of stroke: continue lipitor, Plavix and ASA  7. Tobacco abuse and Alcohol abuse: Did counseling about importance of quitting smoking. Cont nicotine patch -Did counseling about the importance of quitting drinking CIWA protocol  8. Hypokalemia:  Repleted   Code Status : Full  Family Communication  : none at bedside, discussed the results of BAL with the patient.   Disposition Plan  : Home, pend work up  Consults  :  PCCM, ID, oncology.  Procedures  : none  DVT Prophylaxis  :  Lovenox  Lab Results  Component Value Date   PLT 327 04/27/2016    Antibiotics  :    Anti-infectives    Start     Dose/Rate Route Frequency Ordered Stop   04/27/16 2000  metroNIDAZOLE (FLAGYL) tablet 500 mg     500 mg Oral Every 8 hours 04/27/16 1548     04/26/16 1800  vancomycin (VANCOCIN) IVPB 1000 mg/200 mL premix     1,000 mg 200 mL/hr over 60 Minutes Intravenous Every 12 hours 04/26/16 1021     04/24/16 2200  vancomycin (VANCOCIN) 500 mg in sodium chloride 0.9 % 100 mL IVPB  Status:  Discontinued     500 mg 100 mL/hr over 60 Minutes Intravenous Every 12 hours 04/24/16 2039 04/26/16 1021   04/24/16 2200  aztreonam (AZACTAM) 1 g in dextrose 5 % 50 mL IVPB     1 g 100 mL/hr over 30 Minutes Intravenous Every 8 hours 04/24/16 2039     04/24/16 2015  metroNIDAZOLE (FLAGYL) IVPB 500 mg  Status:  Discontinued     500 mg 100 mL/hr over 60 Minutes Intravenous  Every 8 hours 04/24/16 2007 04/27/16 1548        Objective:   Filed Vitals:   04/28/16 1859 04/29/16 0003 04/29/16 0514 04/29/16 1725  BP: 130/58 130/72 126/72 135/84  Pulse: 82 74 71 73  Temp: 98.3 F (36.8 C) 98.7 F (37.1 C) 98.2 F (36.8 C) 98.2 F (36.8 C)  TempSrc: Oral Oral Oral Oral  Resp: '16  18 17  '$ Height:      Weight:      SpO2: 98% 96% 98% 96%    Wt Readings from Last 3 Encounters:  04/28/16 51.256 kg (113 lb)  03/16/16 51.5 kg (113 lb 8.6 oz)  02/18/16 48.081 kg (106 lb)     Intake/Output Summary (Last 24 hours) at 04/29/16 1813 Last data filed at 04/29/16 1538  Gross per 24 hour  Intake   1395 ml  Output   1600 ml  Net   -205 ml     Physical Exam    General: No diaphoresis, anxious, no acute distress  Cardiovascular: Regular rate and rhythm no murmurs rubs or gallops  Respiratory: Clear to auscultation bilaterally & incr wob  Abdomen: Nondistended bowel sounds normal nontender palpation Musculoskeletal: Moving all extremities, no deformity, 5 out of 5 strength   Data Review:    CBC  Recent Labs Lab 04/25/16 1216 04/26/16 0334 04/27/16 0637  WBC 18.0* 13.9* 13.3*  HGB 11.2* 11.2* 12.5  HCT 33.6* 34.3* 38.6  PLT 282 303 327  MCV 96.6 97.2 98.2  MCH 32.2 31.7 31.8  MCHC 33.3 32.7 32.4  RDW 13.3 13.5 13.5  LYMPHSABS 1.0 1.2  --   MONOABS 1.6* 1.1*  --   EOSABS 0.7 1.6*  --   BASOSABS 0.0 0.0  --     Chemistries   Recent Labs Lab 04/24/16 2036 04/25/16  1216 04/26/16 0334 04/27/16 0637 04/29/16 0551  NA 130* 133* 133* 134* 131*  K 3.2* 3.4* 3.1* 3.5 3.5  CL 96* 101 101 100* 95*  CO2 '24 26 24 27 29  '$ GLUCOSE 150* 98 86 96 92  BUN <5* 5* 5* <5* <5*  CREATININE 0.39* 0.43* 0.38* 0.35* 0.43*  CALCIUM 8.6* 8.4* 8.0* 8.0* 8.2*  MG 1.5* 2.0 1.7  --  1.7   ------------------------------------------------------------------------------------------------------------------ No results for input(s): CHOL, HDL, LDLCALC, TRIG,  CHOLHDL, LDLDIRECT in the last 72 hours.  No results found for: HGBA1C ------------------------------------------------------------------------------------------------------------------ No results for input(s): TSH, T4TOTAL, T3FREE, THYROIDAB in the last 72 hours.  Invalid input(s): FREET3 ------------------------------------------------------------------------------------------------------------------ No results for input(s): VITAMINB12, FOLATE, FERRITIN, TIBC, IRON, RETICCTPCT in the last 72 hours.  Coagulation profile  Recent Labs Lab 04/24/16 2036  INR 0.98    No results for input(s): DDIMER in the last 72 hours.  Cardiac Enzymes No results for input(s): CKMB, TROPONINI, MYOGLOBIN in the last 168 hours.  Invalid input(s): CK ------------------------------------------------------------------------------------------------------------------    Component Value Date/Time   BNP 129.1* 04/25/2016 0500    Inpatient Medications  Scheduled Meds: . aspirin EC  81 mg Oral Daily  . atorvastatin  10 mg Oral QHS  . aztreonam  1 g Intravenous Q8H  . citalopram  20 mg Oral QHS  . citalopram  40 mg Oral q morning - 10a  . clopidogrel  75 mg Oral Daily  . dextromethorphan-guaiFENesin  1 tablet Oral BID  . diatrizoate meglumine-sodium  15 mL Oral Q1 Hr x 2  . enoxaparin (LOVENOX) injection  40 mg Subcutaneous Q24H  . feeding supplement (ENSURE ENLIVE)  237 mL Oral BID BM  . folic acid  1 mg Oral Daily  . metroNIDAZOLE  500 mg Oral Q8H  . multivitamin with minerals  1 tablet Oral Daily  . nicotine  21 mg Transdermal Daily  . thiamine  100 mg Oral Daily  . vancomycin  1,000 mg Intravenous Q12H   Continuous Infusions:   PRN Meds:.albuterol  Micro Results Recent Results (from the past 240 hour(s))  Culture, blood (x 2)     Status: None   Collection Time: 04/24/16  8:36 PM  Result Value Ref Range Status   Specimen Description BLOOD RIGHT ANTECUBITAL  Final   Special Requests  BOTTLES DRAWN AEROBIC ONLY 5CC  Final   Culture NO GROWTH 5 DAYS  Final   Report Status 04/29/2016 FINAL  Final  Culture, blood (x 2)     Status: None   Collection Time: 04/24/16  8:39 PM  Result Value Ref Range Status   Specimen Description BLOOD LEFT HAND  Final   Special Requests BOTTLES DRAWN AEROBIC AND ANAEROBIC 5CC  Final   Culture NO GROWTH 5 DAYS  Final   Report Status 04/29/2016 FINAL  Final  C difficile quick scan w PCR reflex     Status: None   Collection Time: 04/25/16 11:09 AM  Result Value Ref Range Status   C Diff antigen NEGATIVE NEGATIVE Final   C Diff toxin NEGATIVE NEGATIVE Final   C Diff interpretation Negative for toxigenic C. difficile  Final  Respiratory Panel by PCR     Status: None   Collection Time: 04/28/16 10:08 AM  Result Value Ref Range Status   Adenovirus NOT DETECTED NOT DETECTED Final   Coronavirus 229E NOT DETECTED NOT DETECTED Final   Coronavirus HKU1 NOT DETECTED NOT DETECTED Final   Coronavirus NL63 NOT DETECTED NOT DETECTED Final  Coronavirus OC43 NOT DETECTED NOT DETECTED Final   Metapneumovirus NOT DETECTED NOT DETECTED Final   Rhinovirus / Enterovirus NOT DETECTED NOT DETECTED Final   Influenza A NOT DETECTED NOT DETECTED Final   Influenza A H1 NOT DETECTED NOT DETECTED Final   Influenza A H1 2009 NOT DETECTED NOT DETECTED Final   Influenza A H3 NOT DETECTED NOT DETECTED Final   Influenza B NOT DETECTED NOT DETECTED Final   Parainfluenza Virus 1 NOT DETECTED NOT DETECTED Final   Parainfluenza Virus 2 NOT DETECTED NOT DETECTED Final   Parainfluenza Virus 3 NOT DETECTED NOT DETECTED Final   Parainfluenza Virus 4 NOT DETECTED NOT DETECTED Final   Respiratory Syncytial Virus NOT DETECTED NOT DETECTED Final   Bordetella pertussis NOT DETECTED NOT DETECTED Final   Chlamydophila pneumoniae NOT DETECTED NOT DETECTED Final   Mycoplasma pneumoniae NOT DETECTED NOT DETECTED Final  Culture, sputum-assessment     Status: None   Collection Time:  04/28/16 11:50 AM  Result Value Ref Range Status   Specimen Description EXPECTORATED SPUTUM  Final   Special Requests NONE  Final   Sputum evaluation   Final    MICROSCOPIC FINDINGS SUGGEST THAT THIS SPECIMEN IS NOT REPRESENTATIVE OF LOWER RESPIRATORY SECRETIONS. PLEASE RECOLLECT. RESULT CALLED TO, READ BACK BY AND VERIFIED WITH: G GLESSON 04/28/16 @ 84 M VESTAL    Report Status 04/28/2016 FINAL  Final  Acid Fast Smear (AFB)     Status: None   Collection Time: 04/28/16  1:55 PM  Result Value Ref Range Status   AFB Specimen Processing Concentration  Final   Acid Fast Smear Negative  Final    Comment: (NOTE) Performed At: [facility] [place], [place] [id number] Lindon Romp MD [id number]    Source (AFB) BRONCHIAL ALVEOLAR LAVAGE  Final    Comment: POSTERIOR  Acid Fast Smear (AFB)     Status: None   Collection Time: 04/28/16  1:55 PM  Result Value Ref Range Status   AFB Specimen Processing Concentration  Final   Acid Fast Smear Negative  Final    Comment: (NOTE) Performed At: [facility] [place], [place] [id number] Lindon Romp MD [id number]    Source (AFB) BRONCHIAL ALVEOLAR LAVAGE  Final    Comment: SUPERIOR  Culture, bal-quantitative     Status: None (Preliminary result)   Collection Time: 04/28/16  3:29 PM  Result Value Ref Range Status   Specimen Description BRONCHIAL ALVEOLAR LAVAGE  Final   Special Requests POSTERIOR SEGMENT  Final   Gram Stain   Final    MODERATE WBC PRESENT,BOTH PMN AND MONONUCLEAR NO ORGANISMS SEEN    Culture CULTURE REINCUBATED FOR BETTER GROWTH  Final   Report Status PENDING  Incomplete  Culture, bal-quantitative     Status: None (Preliminary result)   Collection Time: 04/28/16  3:30 PM  Result Value Ref Range Status   Specimen Description BRONCHIAL ALVEOLAR LAVAGE  Final   Special Requests SUPERIOR SEGMENT  Final   Gram Stain   Final    DEGENERATED CELLULAR MATERIAL PRESENT NO  ORGANISMS SEEN    Culture CULTURE REINCUBATED FOR BETTER GROWTH  Final   Report Status PENDING  Incomplete    Radiology Reports Ir Radiologist Eval & Mgmt  04/21/2016  EXAM: ESTABLISHED PATIENT OFFICE VISIT CHIEF COMPLAINT: Chest and cold infection.  Pain with passing urine. She denies recent chills, fever or rigors. 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 The patient had an uneventful procedure. The patient did develop some symptoms of breathing difficulties which responded to 2 albuterol inhalation treatments. The patient was then discharged and had a chest x-ray which revealed the presence of a left lower lobe consolidation probably inflammatory. The patient was then treated with antibiotics for about 7 days with improvement. However, she was then started on a short course of prednisone which significantly improved her breathing difficulties and improved her appetite. However, since she stopped taking the prednisone, the patient has developed continuous diarrhea which is watery and without any noticeable reddish discoloration. These are associated mild tummy aches. Her appetite is decreased and her weight loss is related to her lack of appetite and diarrhea. She continues to have a mild cough occasionally associated with phlegm production. However, she denies any hemoptysis. She also has symptoms of urgency of micturition, however, she predates that to her procedure and has been experiencing this for about 3-4 months. She denies any hematuria or dysuria. She has noticed, however, easy bruisability in her skin on her forearms and also her thighs. Her past medical history, previous surgical history, social history, family history as per her H and P for from 3 weeks ago, unchanged. Present medications: Aspirin  81 mg a day. Plavix 75 mg a day. Atorvastatin. Celexa. Combivent. Vitamin-D, Ergocalciferol. Allergies: Penicillins which causes anaphylaxis and shortness of breath. PHYSICAL EXAMINATION: In no acute distress. However, appears pale and thin. Affect normal. Neurological fully intact. ASSESSMENT AND PLAN: The patient's immediate post treatment arteriograms were reviewed with her and her friend. These depict placement of a pipeline flow diverter device with stasis within the endovascularly treated aneurysm involving the left internal carotid artery intracranially. Also noted was a chest x-ray performed which depicted the presence of a left lower lobe consolidation probably inflammatory. The plan today is to obtain a platelet inhibition study P2Y12. Additionally the patient has been advised to revisit with her primary care doctor for consideration of repeat chest x-ray if warranted. Also the patient has been advised to drink plenty of liquids because of her diarrhea and to review her symptoms of diarrhea with her primary care physician as quickly as possible. 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 Questions were answered to her satisfaction. The patient was asked to call should she have any concerns or questions. Electronically Signed   By: Luanne Bras M.D.   On: 04/17/2016 12:25    Time Spent in minutes  25 minutes   Wang Granada M.D on 04/29/2016 at 6:13 PM  Between 7am to 7pm - Pager - [phone] After 7pm go to [email or web address] - password [email or web address]  [facility] -  Office  [phone]

## 2016-04-29 NOTE — Progress Notes (Signed)
Pharmacy Antibiotic Note Kylie Valencia is a 60 y.o. female admitted to [facility] 6/15 by PCP with a chest x-ray showing a lung abnormality.  CT showed a lower lobe cavitary lesion/pneumonia and is now s/p bronchoscopy on 6/20. Currently on day 5 of therapy with Vancomycin + Azactam + Flagyl for empiric cavitary PNA coverage.    Goal of Therapy: Vancomycin trough of 15-20 mcg/ml  Plan: 1. Remains on vancomycin 1 gram IV q 12 hours following dose change on 6/18; will obtain vancomycin trough this evening to evaluate current dosing regimen following recent dose change  2. Continue Azactam 2g IV every 8 hours 3. Continue Flagyl 500 mg IV every 8 hours    Height: '5\' 4"'$  (162.6 cm) Weight: 113 lb (51.256 kg) IBW/kg (Calculated) : 54.7  Temp (24hrs), Avg:98.3 F (36.8 C), Min:98.1 F (36.7 C), Max:98.7 F (37.1 C)   Recent Labs Lab 04/24/16 2036 04/24/16 2354 04/25/16 1216 04/26/16 0334 04/26/16 0908 04/27/16 0637 04/29/16 0551  WBC  --   --  18.0* 13.9*  --  13.3*  --   CREATININE 0.39*  --  0.43* 0.38*  --  0.35* 0.43*  LATICACIDVEN 1.3 1.0  --   --   --   --   --   VANCOTROUGH  --   --   --   --  7*  --   --     Estimated Creatinine Clearance: 59.8 mL/min (by C-G formula based on Cr of 0.43).    Allergies  Allergen Reactions  . Penicillins Anaphylaxis, Shortness Of Breath and Other (See Comments)    Has patient had a PCN reaction causing immediate rash, facial/tongue/throat swelling, SOB or lightheadedness with hypotension: Yes Has patient had a PCN reaction causing severe rash involving mucus membranes or skin necrosis: No Has patient had a PCN reaction that required hospitalization Yes Has patient had a PCN reaction occurring within the last 10 years: No If all of the above answers are "NO", then may proceed with Cephalosporin use.     Antimicrobials this admission: Vanc 6/16 >> Azactam 6/16 >> Flagyl 6/16 >>  Dose adjustments this admission: * 6/18 VT 7 mcg/ml  (1 hr early) >> adjust to 1g/12h  Microbiology results: 6/20 bronchoscopy: px 6/16 BCx >> ngtd 6/16 RCx >> negative  6/16 CDiff >> negative   Thank you for allowing pharmacy to be a part of this patient's care.  Vincenza Hews, PharmD, BCPS 04/29/2016, 8:04 AM Pager: [phone]

## 2016-04-29 NOTE — Progress Notes (Signed)
Wasted 1 mg versed in sink in bronch room per Tammie Readling and [employer] RRT on day of procedure  Rush Farmer, M.D. [facility] Pulmonary/Critical Care Medicine. Pager: [phone]. After hours pager: [phone].

## 2016-04-29 NOTE — Progress Notes (Signed)
PULMONARY CONSULT   Name: Kylie Valencia MRN: [id number] DOB: 1956-03-27    ADMISSION DATE:  04/24/2016 CONSULTATION DATE:  6/16  REFERRING MD :    CHIEF COMPLAINT:  Cavitary PNA   BRIEF PATIENT DESCRIPTION:  60 year old female initially admitted to [facility] w/ CC: shortness of breath and cough. Has PMH sig for ETOH abuse, COPD, depression and most recently a CVA back in Feb 2017. Since d/c she has had multiple rounds of abx for recurrent PNAs. Most recently she had gone to see her PCP on 6/15 w/ cc cough and SOB. She was placed on Levaquin and CXR was obtained. This raised concern for cavitation in the LLL so she was referred to the ER for further eval. Further eval in the ER consisted of CT chest which showed : LLL lung lesion w/ associated lymphadenopathy. [facility] evaluation.   ->ETOH daily -> dental caries  -> recent stroke  SIGNIFICANT EVENTS  6/20 FOB per jy. Unable to pass scope lll. Brushed and cultured.  STUDIES:  CT chest 6/15: cavitary LLL lung lesion w/ associated infiltrate. Increased hilar adenopathy   SUBJECTIVE:  No events overnight, feels ok. Wants to go home  VITAL SIGNS: Temp:  [98.1 F (36.7 C)-98.7 F (37.1 C)] 98.2 F (36.8 C) (06/21 0514) Pulse Rate:  [71-113] 71 (06/21 0514) Resp:  [7-18] 18 (06/21 0514) BP: (123-203)/(58-175) 126/72 mmHg (06/21 0514) SpO2:  [89 %-98 %] 98 % (06/21 0514) Weight:  [113 lb (51.256 kg)] 113 lb (51.256 kg) (06/20 1245)  PHYSICAL EXAMINATION: General:  Awake, alert, no distress. Wants to go home. watching TV Neuro:  Awake, alert, no focal def HEENT:  [place], has partials upper and lower. [place]/AT, PERRL, EOM-I and MMM. Cardiovascular:  RRR w/out MRG Lungs:  Decreased BS at the left lung posteriorly but otherwise clear. Abdomen:  Soft, not tender + bowel sounds  Musculoskeletal:  Equal st and bulk  Skin:  Warm and dry    Recent Labs Lab 04/26/16 0334 04/27/16 0637  04/29/16 0551  NA 133* 134* 131*  K 3.1* 3.5 3.5  CL 101 100* 95*  CO2 '24 27 29  '$ BUN 5* <5* <5*  CREATININE 0.38* 0.35* 0.43*  GLUCOSE 86 96 92   ASSESSMENT / PLAN:  60 year old female with known cavitation noted on CT on February of this year who presents with a pneumonia and multiple pulmonary nodules. On exam, crackles bibasilarly and rhonchi L>R. I reviewed chest CT myself, cavitation with debris noted, surrounding infiltrate and multiple pulmonary nodules. .  Pulmonary nodules/?mass: differential very wide. - Perform bronchoscopy today with brush, BAL and biopsy as able. - Inspect left airway and evaluate for obstruction.     - May begin diet after recovery.  Cavitary pneumonia/lesion: etoh abuse so likely progressive aspiration and adding a stroke makes chronic aspiration very likely with cavitary pneumonia but can not r/o post obstructive with smoking history. - Flagyl/aztreonam/vanc until BAL cultures result. - F/U on cultures. - ID to make recommendations on long term abx. - F/U on bronch culture.  Hypoxemia: - Titrate O2 for sat of 88-92%. - Ambulatory desat prior to discharge for home O2.  COPD: - PRN albuterol.    - ICS. - Will need PFT's once fully treated for PNA.   Richardson Landry Minor ACNP Maryanna Shape PCCM Pager [phone] till 3 pm If no answer page [phone] 04/29/2016, 9:54 AM  Rush Farmer, M.D. [facility] Pulmonary/Critical Care Medicine.  Pager: [phone]. After hours pager: [phone].

## 2016-04-29 NOTE — Progress Notes (Signed)
Physical Therapy Treatment Patient Details Name: Kylie Valencia MRN: [id number] DOB: 1956-03-06 Today's Date: 04/29/2016    History of Present Illness 60 year old female with history of COPD and stroke in 2/17 admitted to [facility] 6/15 by PCP with a chest x-ray showing a lung abnormality. CT showed a lower lobe cavitary lesion/pneumonia. Transferred to [name] 6/16 for further evaluation and possible bronchoscopy. At [facility], she was receiving Vancomycin and Aztreonam for pneumonia     PT Comments    Patient demonstrated improved steadiness with mobility this session with DGI score of 20. SpO2 decreased to 85% with ambulation on RA. Donned 1.5 L O2 end of session and SpO2 95%. Continue to progress as tolerated with anticipated d/c home with HHPT.   Follow Up Recommendations  Home health PT;Supervision/Assistance - 24 hour     Equipment Recommendations  Other (comment) (Possibly home O2)    Recommendations for Other Services       Precautions / Restrictions Precautions Precautions: Fall Restrictions Weight Bearing Restrictions: No    Mobility  Bed Mobility Overal bed mobility: Independent                Transfers Overall transfer level: Independent                  Ambulation/Gait Ambulation/Gait assistance: Supervision Ambulation Distance (Feet): 300 Feet Assistive device: None Gait Pattern/deviations: Step-through pattern     General Gait Details: supervision for safety; pt unsteady at times with 1 posterior LOB in room but able to recover without assist   Stairs            Wheelchair Mobility    Modified Rankin (Stroke Patients Only)       Balance     Sitting balance-Leahy Scale: Good       Standing balance-Leahy Scale: Fair                 High Level Balance Comments: horizontal and vertical head turns; unsteadiness noted especially with vertical head turns    Cognition Arousal/Alertness: Awake/alert Behavior  During Therapy: WFL for tasks assessed/performed Overall Cognitive Status: Within Functional Limits for tasks assessed                      Exercises      General Comments General comments (skin integrity, edema, etc.): ambulated on RA with SpO2 decreased to 85% with pt asymptomatic and donned 1.5 L O2 end of session with SpO2 95%      Pertinent Vitals/Pain Pain Assessment: No/denies pain    Home Living                      Prior Function            PT Goals (current goals can now be found in the care plan section) Acute Rehab PT Goals Patient Stated Goal: to go home PT Goal Formulation: With patient Time For Goal Achievement: 05/09/16 Potential to Achieve Goals: Good Progress towards PT goals: Progressing toward goals    Frequency  Min 3X/week    PT Plan Current plan remains appropriate    Co-evaluation             End of Session Equipment Utilized During Treatment: Gait belt;Oxygen Activity Tolerance: Patient tolerated treatment well Patient left: with call bell/phone within reach;in chair     Time: 8127-5170 PT Time Calculation (min) (ACUTE ONLY): 20 min  Charges:  $Gait Training: 8-22 mins  G Codes:      Salina April, PTA Pager: [phone]   04/29/2016, 1:13 PM

## 2016-04-29 NOTE — Progress Notes (Signed)
[facility]    Date of Admission:  04/24/2016    ID: CALEN POSCH is a 60 y.o. female with a LLL cavitary lesion.   Principal Problem:   Cavitary pneumonia Active Problems:   Brain aneurysm   Hyperlipidemia   Depression   Peripheral vascular disease (HCC)   COPD (chronic obstructive pulmonary disease) (HCC)   Stroke (HCC)   Tobacco abuse   Pneumonia   Protein-calorie malnutrition-moderate   Solitary pulmonary nodule   Lung mass   Hypoxemia  Subjective: Ms. Been is a 72 F with PMHx of alcohol abuse, tobacco abuse, HLD, COPD, PVD, HLD, CVA, Depression, and brain aneurysm who presented from [facility] with complaints of cough and shortness of breath and was found to have a LLL cavitary lesion.  Patient was seen and examined this morning. She states she feels much better and that her productive cough has subsided. She denies any fever, chills, nausea, vomiting or shortness of breath.   Medications:  . aspirin EC  81 mg Oral Daily  . atorvastatin  10 mg Oral QHS  . aztreonam  1 g Intravenous Q8H  . citalopram  20 mg Oral QHS  . citalopram  40 mg Oral q morning - 10a  . clopidogrel  75 mg Oral Daily  . dextromethorphan-guaiFENesin  1 tablet Oral BID  . enoxaparin (LOVENOX) injection  40 mg Subcutaneous Q24H  . feeding supplement (ENSURE ENLIVE)  237 mL Oral BID BM  . folic acid  1 mg Oral Daily  . metroNIDAZOLE  500 mg Oral Q8H  . multivitamin with minerals  1 tablet Oral Daily  . nicotine  21 mg Transdermal Daily  . thiamine  100 mg Oral Daily  . vancomycin  1,000 mg Intravenous Q12H    Objective: Filed Vitals:   04/28/16 1501 04/28/16 1859 04/29/16 0003 04/29/16 0514  BP: 142/65 130/58 130/72 126/72  Pulse: 85 82 74 71  Temp: 98.1 F (36.7 C) 98.3 F (36.8 C) 98.7 F (37.1 C) 98.2 F (36.8 C)  TempSrc:  Oral Oral Oral  Resp: '16 16  18  '$ Height:      Weight:      SpO2: 95% 98% 96% 98%   General: Vital signs  reviewed.  Patient is thin appearing, in no acute distress and cooperative with exam.  Cardiovascular: RRR, S1 normal, S2 normal, no murmurs, gallops, or rubs. Pulmonary/Chest: Decreased breath sounds in left lower lung field. Scattered inspiratory crackles, no wheezes, or rhonchi. Abdominal: Soft, non-tender, non-distended, BS + Extremities: No lower extremity edema bilaterally Skin: Warm, dry and intact.   Lab Results  Recent Labs  04/27/16 0637 04/29/16 0551  WBC 13.3*  --   HGB 12.5  --   HCT 38.6  --   NA 134* 131*  K 3.5 3.5  CL 100* 95*  CO2 27 29  BUN <5* <5*  CREATININE 0.35* 0.43*   Micro: BCx 6/16>> NGTD x 2 C Diff>> Negative Sputum Cx pending Respiratory Panel PCR negative BAL Gram stain, AFB pending BAL culture 6/20: No organisms  Antibiotics: Aztreonam 6/16>> Flagyl 6/16>> Vancomycin 6/16>>  Assessment/Plan: Ms. Bulman is a 27 F with PMHx of alcohol abuse, tobacco abuse, HLD, COPD, PVD, HLD, CVA, Depression, and brain aneurysm who presented from [facility] with complaints of cough and shortness of breath and found to have a cavitary LLL lesion with associated infiltrate and hilar adenopathy.  Cavitary Lung Lesion: Patient underwent bronchoscopy  yesterday with BAL. Results of BAL gram stain and AFB are pending. BAL culture shows no organisms so far. Respiratory panel negative. BCx from 6/16 remain NGTD. Strep pneumo negative, legionella pending, ANCA and MPO negative.  -Follow up cultures -Continue aztreonam, vancomycin, and flagyl -Check aspergillus antigen, histoplasma BAL and urinary antigen -Legionella pending  Tobacco and Alcohol abuse: -On nicotine patch -On CIWA -Avoid alcohol while on Flagyl  Martyn Malay, DO PGY-2 Internal Medicine Resident Pager # [phone] 04/29/2016 9:10 AM

## 2016-04-29 NOTE — Care Management Important Message (Addendum)
ERROR. IM not delivered, patient not under Medicare   Important Message  Patient Details  Name: Kylie Valencia MRN: [id number] Date of Birth: 05-30-1956   Medicare Important Message Given:  Yes    Carles Collet, RN 04/29/2016, 8:56 AMImportant Message  Patient Details  Name: Kylie Valencia MRN: [id number] Date of Birth: August 22, 1956   Medicare Important Message Given:  Yes    Carles Collet, RN 04/29/2016, 8:56 AM

## 2016-04-30 DIAGNOSIS — C3490 Malignant neoplasm of unspecified part of unspecified bronchus or lung: Secondary | ICD-10-CM

## 2016-04-30 DIAGNOSIS — J42 Unspecified chronic bronchitis: Secondary | ICD-10-CM

## 2016-04-30 DIAGNOSIS — E871 Hypo-osmolality and hyponatremia: Secondary | ICD-10-CM

## 2016-04-30 DIAGNOSIS — E46 Unspecified protein-calorie malnutrition: Secondary | ICD-10-CM

## 2016-04-30 DIAGNOSIS — J9811 Atelectasis: Secondary | ICD-10-CM

## 2016-04-30 DIAGNOSIS — Z720 Tobacco use: Secondary | ICD-10-CM

## 2016-04-30 LAB — MISC LABCORP TEST (SEND OUT): LABCORP TEST CODE: 183512

## 2016-04-30 LAB — HISTOPLASMA ANTIGEN, URINE: Histoplasma Antigen, urine: 0 ng/mL (ref 0.00–0.49)

## 2016-04-30 LAB — LEGIONELLA PNEUMOPHILA SEROGP 1 UR AG: L. PNEUMOPHILA SEROGP 1 UR AG: NEGATIVE

## 2016-04-30 MED ORDER — FOLIC ACID 1 MG PO TABS
1.0000 mg | ORAL_TABLET | Freq: Every day | ORAL | Status: AC
Start: 2016-04-30 — End: ?

## 2016-04-30 MED ORDER — ADULT MULTIVITAMIN W/MINERALS CH: 1.0000 | ORAL_TABLET | Freq: Every day | ORAL | Status: AC

## 2016-04-30 MED ORDER — LEVOFLOXACIN 750 MG PO TABS: 750.0000 mg | ORAL_TABLET | Freq: Every day | ORAL | Status: AC

## 2016-04-30 MED ORDER — NICOTINE 21 MG/24HR TD PT24: 21.0000 mg | MEDICATED_PATCH | Freq: Every day | TRANSDERMAL | Status: AC

## 2016-04-30 MED ORDER — ENSURE ENLIVE PO LIQD: 237.0000 mL | Freq: Two times a day (BID) | ORAL | Status: AC

## 2016-04-30 MED ORDER — DM-GUAIFENESIN ER 30-600 MG PO TB12: 1.0000 | ORAL_TABLET | Freq: Two times a day (BID) | ORAL | Status: AC

## 2016-04-30 MED ORDER — METRONIDAZOLE 500 MG PO TABS: 500.0000 mg | ORAL_TABLET | Freq: Three times a day (TID) | ORAL | Status: AC

## 2016-04-30 MED ORDER — THIAMINE HCL 100 MG PO TABS: 100.0000 mg | ORAL_TABLET | Freq: Every day | ORAL | Status: AC

## 2016-04-30 NOTE — Care Management Note (Signed)
Case Management Note  Patient Details  Name: Kylie Valencia MRN: [id number] Date of Birth: September 21, 1956  Subjective/Objective:                 Patient admitted with cavitary PNA.   Action/Plan:  Will DC to home, self care- declined HH. No O2 needs.   Expected Discharge Date:                  Expected Discharge Plan:  Home/Self Care  In-House Referral:     Discharge planning Services  CM Consult  Post Acute Care Choice:  NA Choice offered to:     DME Arranged:  N/A DME Agency:     HH Arranged:    [place] Agency:  NA  Status of Service:  Completed, signed off  If discussed at [place] of Stay Meetings, dates discussed:    Additional Comments:  Carles Collet, RN 04/30/2016, 2:59 PM

## 2016-04-30 NOTE — Discharge Summary (Signed)
Physician Discharge Summary  Kylie Valencia UUV:[id number] DOB: [birth year] DOA: 04/24/2016  PCP: Charletta Cousin, MD  Admit date: 04/24/2016 Discharge date: 04/30/2016  Admitted From: Home  Disposition:  Home  Recommendations for Outpatient Follow-up:  1. Follow up with PCP in 1-2 weeks 2. Please obtain BMP/CBC in one week 3. Please follow up on the BAL results with PCP.  4. Please follow up with [facility].     Discharge Condition: stable.  CODE STATUS: full code Diet recommendation: Heart Healthy   Brief/Interim Summary: 60 year old femaleWith past medical history of COPD, alcohol abuse, depression, CVA, admitted initially trend of hospital for dyspnea and cough, she had multiple rounds of antibiotics for recurrent pneumonia treatment, CT chest significant for left lower lung lesion with associated lymphadenopathy especially is for cavitary pneumonia, admitted for further management. 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   Discharge Diagnoses:  Principal Problem:   Cavitary pneumonia Active Problems:   Brain aneurysm   Hyperlipidemia   Depression   Peripheral vascular disease (HCC)   COPD (chronic obstructive pulmonary disease) (HCC)   Stroke (HCC)   Tobacco abuse   Pneumonia   Protein-calorie malnutrition-moderate   Solitary pulmonary nodule   Lung mass   Hypoxemia  Hypoxemia Acute respiratory failure possibly from the Cavitary pneumonia: As showed by CT angiogram. Started on broad spectrum antibiotics.  -cont IV Vancomycin and Aztreonam and Flagyl, cont, ID consulted regarding long-term enteric management. 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  CT abd andpelvis ordered to evaluate for metastases.  Oncology Dr Alvy Bimler consulted for further recommendations.     2. COPD (chronic obstructive pulmonary disease) (HCC) No signs of acute exacerbation. No wheezing or rhonchi on auscultation. Cont as necessary albuterol nebulizer  3. Sepsis: pt meets criteria for sepsis with leukocytosis, HR>90 and RR=20.  Much improved. Resume broad spectrum antibiotics.   4. HLD: Last LDL was not on record. Continue home medications: Lipitor  5. Depression: Stable, no suicidal or homicidal ideations. Continue home medications: Celexa  6. History of stroke: continue lipitor, Plavix and ASA  7. Tobacco abuse and Alcohol abuse: Did counseling about importance of quitting smoking. Cont nicotine patch -Did counseling about the importance of quitting drinking CIWA protocol  8. Hypokalemia: Repleted  Discharge Instructions  Discharge Instructions    Discharge instructions    Complete by:  As directed   Oncology office will call you for an appointment.  Please follow up with your PCP in one week.  Please complete 7 days of antibiotics to complete the course.  Please follow up with bronchoscopy culture report.            Medication List    TAKE these medications        aspirin EC 81 MG tablet  Take 81 mg by mouth daily. Reported on 03/12/2016     atorvastatin 10 MG tablet  Commonly known as:  LIPITOR  Take 10 mg by mouth at bedtime.     citalopram 20 MG tablet  Commonly known as:  CELEXA  Take 20 mg by mouth every evening. Take 20 mg along with 40 mg to equal 60 mg nightly     citalopram 40 MG tablet  Commonly known as:  CELEXA  Take 40 mg by mouth every evening. Take 40 mg along with 20 mg to equal  60 mg nightly     clopidogrel 75 MG tablet  Commonly known as:  PLAVIX  Take 75 mg by mouth daily.     COMBIVENT RESPIMAT 20-100 MCG/ACT Aers respimat  Generic drug:  Ipratropium-Albuterol  Inhale 1-2 puffs into the lungs daily as needed for wheezing.     dextromethorphan-guaiFENesin 30-600 MG 12hr tablet  Commonly known as:  MUCINEX DM  Take 1  tablet by mouth 2 (two) times daily.     feeding supplement (ENSURE ENLIVE) Liqd  Take 237 mLs by mouth 2 (two) times daily between meals.     folic acid 1 MG tablet  Commonly known as:  FOLVITE  Take 1 tablet (1 mg total) by mouth daily.     levofloxacin 750 MG tablet  Commonly known as:  LEVAQUIN  Take 1 tablet (750 mg total) by mouth daily.     metroNIDAZOLE 500 MG tablet  Commonly known as:  FLAGYL  Take 1 tablet (500 mg total) by mouth every 8 (eight) hours.     multivitamin with minerals Tabs tablet  Take 1 tablet by mouth daily.     nicotine 21 mg/24hr patch  Commonly known as:  NICODERM CQ - dosed in mg/24 hours  Place 1 patch (21 mg total) onto the skin daily.     thiamine 100 MG tablet  Take 1 tablet (100 mg total) by mouth daily.     Vitamin D (Ergocalciferol) 50000 units Caps capsule  Commonly known as:  DRISDOL  Take 50,000 Units by mouth every 14 (fourteen) days.           Follow-up Information    Follow up with Charletta Cousin, MD. Schedule an appointment as soon as possible for a visit in 1 week.   Specialty:  Family Medicine   Contact information:   [address] [place] [address] [phone]       Follow up with [facility]   Specialty:  Oncology   Why:  their office will call for appointment.    Contact information:   [place] [id number] [place] [address] [phone]     Allergies  Allergen Reactions  . Penicillins Anaphylaxis, Shortness Of Breath and Other (See Comments)    Has patient had a PCN reaction causing immediate rash, facial/tongue/throat swelling, SOB or lightheadedness with hypotension: Yes Has patient had a PCN reaction causing severe rash involving mucus membranes or skin necrosis: No Has patient had a PCN reaction that required hospitalization Yes Has patient had a PCN reaction occurring within the last 10 years: No If all of the above answers are "NO", then may  proceed with Cephalosporin use.     Consultations:  PCCM  Oncology  Infectious Disease.   Procedures/Studies: Ct Abdomen Pelvis W Contrast  04/29/2016  CLINICAL DATA:  Acute respiratory failure possibly from the Cavitary pneumonia: As showed by CT angiogram. Started on broad spectrum antibiotics. 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 CT abd andpelvis ordered to evaluate for metastases. h/o tubal ligation EXAM: CT ABDOMEN AND PELVIS WITH CONTRAST TECHNIQUE: Multidetector CT imaging of the abdomen and pelvis was performed using the standard protocol following bolus administration of intravenous contrast. CONTRAST:  167m ISOVUE-300 IOPAMIDOL (ISOVUE-300) INJECTION 61% COMPARISON:  Chest CT, 04/23/2016. FINDINGS: Lung bases: Cavitary mass for pneumonia in the left lung  base is noted. There is more confluent opacification along the anterior aspect of the left lower lobe than it was on the earlier exam. Stable nodule in the right lower lobe. Heart normal in size. Hepatobiliary: No liver mass or lesion. Normal gallbladder. No bile duct dilation. Spleen: Unremarkable. Pancreas:  Unremarkable. Adrenal glands:  No masses. Kidneys, ureters, bladder: Somewhat wedge-shaped area of decreased attenuation and enhancement in the midpole the left kidney. This suggests an infarct. No other areas of abnormal renal attenuation or enhancement. 1 mm nonobstructing stone in the midpole the right kidney. There is mild bilateral renal collecting system dilation. The ureters are normal in course and in caliber. The bladder is unremarkable. Uterus and adnexa:  Unremarkable. Lymph nodes: There are sub cm calcified nodes along the gastrohepatic ligament. There are no pathologically enlarged lymph nodes. Ascites:  None. Gastrointestinal: Mild  dilation of the transverse and right colon. No colonic wall thickening or adjacent inflammation. Normal appearance of the small bowel. Normal appendix visualized. Vascular: There are atherosclerotic changes along the abdominal aorta. There has been a previous aortobifemoral graft, which is patent. Musculoskeletal: No osteoblastic or osteolytic lesions. IMPRESSION: 1. No evidence of metastatic disease within the abdomen or pelvis. 2. Cavitary mass or pneumonia in the left lower lobe is similar to the prior study although there is more confluent opacity than there was on the prior exam mostly along the anterior left lower lobe. This may reflect worsening pneumonia or worsening post obstructive change. 3. There is a wedge-shaped area of hypoattenuation in decreased enhancement in the left kidney at the midpole suggesting an infarct. This is of unclear chronicity, but may be recent. This does not appear to be a mass. Infection is not excluded. 4. Aortic atherosclerosis. Electronically Signed   By: Lajean Manes M.D.   On: 04/29/2016 20:40   Ir Radiologist Eval & Mgmt  04/21/2016  EXAM: ESTABLISHED PATIENT OFFICE VISIT CHIEF COMPLAINT: Chest and cold infection.  Pain with passing urine. She denies recent chills, fever or rigors. 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 The patient had an uneventful procedure. The patient did develop some symptoms of breathing difficulties which responded to 2 albuterol inhalation treatments. The patient was then discharged and had a chest x-ray which revealed the presence of a left lower lobe consolidation probably inflammatory. The patient was then treated with antibiotics for about 7 days with improvement. However, she was then started on a short course of prednisone which significantly  improved her breathing difficulties and improved her appetite. However, since she stopped taking the prednisone, the patient has developed continuous diarrhea which is watery and without any noticeable reddish discoloration. These are associated mild tummy aches. Her appetite is decreased and her weight loss is related to her lack of appetite and diarrhea. She continues to have a mild cough occasionally associated with phlegm production. However, she denies any hemoptysis. She also has symptoms of urgency of micturition, however, she predates that to her procedure and has been experiencing this for about 3-4 months. She denies any hematuria or dysuria. She has noticed, however, easy bruisability in her skin on her forearms and also her thighs. Her past medical history, previous surgical history, social history, family history as per her H and P for from 3 weeks ago, unchanged. Present medications: Aspirin 81 mg a day. Plavix  75 mg a day. Atorvastatin. Celexa. Combivent. Vitamin-D, Ergocalciferol. Allergies: Penicillins which causes anaphylaxis and shortness of breath. PHYSICAL EXAMINATION: In no acute distress. However, appears pale and thin. Affect normal. Neurological fully intact. ASSESSMENT AND PLAN: The patient's immediate post treatment arteriograms were reviewed with her and her friend. These depict placement of a pipeline flow diverter device with stasis within the endovascularly treated aneurysm involving the left internal carotid artery intracranially. Also noted was a chest x-ray performed which depicted the presence of a left lower lobe consolidation probably inflammatory. The plan today is to obtain a platelet inhibition study P2Y12. Additionally the patient has been advised to revisit with her primary care doctor for consideration of repeat chest x-ray if warranted. Also the patient has been advised to drink plenty of liquids because of her diarrhea and to review her symptoms of diarrhea with her  primary care physician as quickly as possible. 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 Questions were answered to her satisfaction. The patient was asked to call should she have any concerns or questions. Electronically Signed   By: Luanne Bras M.D.   On: 04/17/2016 12:25       Subjective: No new complaints.   Discharge Exam: Filed Vitals:   04/30/16 0631 04/30/16 1416  BP: 143/73 120/55  Pulse: 88 83  Temp: 98 F (36.7 C) 98.5 F (36.9 C)  Resp: 18 20   Filed Vitals:   04/29/16 1725 04/29/16 2128 04/30/16 0631 04/30/16 1416  BP: 135/84 155/64 143/73 120/55  Pulse: 73 81 88 83  Temp: 98.2 F (36.8 C) 98 F (36.7 C) 98 F (36.7 C) 98.5 F (36.9 C)  TempSrc: Oral   Oral  Resp: '17 18 18 20  '$ Height:      Weight:      SpO2: 96% 93% 92% 93%    General: Pt is alert, awake, not in acute distress Cardiovascular: RRR, S1/S2 +, no rubs, no gallops Respiratory: CTA bilaterally, no wheezing, no rhonchi Abdominal: Soft, NT, ND, bowel sounds + Extremities: no edema, no cyanosis    The results of significant diagnostics from this hospitalization (including imaging, microbiology, ancillary and laboratory) are listed below for reference.     Microbiology: Recent Results (from the past 240 hour(s))  Culture, blood (x 2)     Status: None   Collection Time: 04/24/16  8:36 PM  Result Value Ref Range Status   Specimen Description BLOOD RIGHT ANTECUBITAL  Final   Special Requests BOTTLES DRAWN AEROBIC ONLY 5CC  Final   Culture NO GROWTH 5 DAYS  Final   Report Status 04/29/2016 FINAL  Final  Culture, blood (x 2)     Status: None   Collection Time: 04/24/16  8:39 PM  Result Value Ref Range Status   Specimen Description BLOOD LEFT HAND  Final   Special Requests BOTTLES DRAWN  AEROBIC AND ANAEROBIC 5CC  Final   Culture NO GROWTH 5 DAYS  Final   Report Status 04/29/2016 FINAL  Final  C difficile quick scan w PCR reflex     Status: None   Collection Time: 04/25/16 11:09 AM  Result Value Ref Range Status   C Diff antigen NEGATIVE NEGATIVE Final   C Diff toxin NEGATIVE NEGATIVE Final   C Diff interpretation Negative for toxigenic  C. difficile  Final  Respiratory Panel by PCR     Status: None   Collection Time: 04/28/16 10:08 AM  Result Value Ref Range Status   Adenovirus NOT DETECTED NOT DETECTED Final   Coronavirus 229E NOT DETECTED NOT DETECTED Final   Coronavirus HKU1 NOT DETECTED NOT DETECTED Final   Coronavirus NL63 NOT DETECTED NOT DETECTED Final   Coronavirus OC43 NOT DETECTED NOT DETECTED Final   Metapneumovirus NOT DETECTED NOT DETECTED Final   Rhinovirus / Enterovirus NOT DETECTED NOT DETECTED Final   Influenza A NOT DETECTED NOT DETECTED Final   Influenza A H1 NOT DETECTED NOT DETECTED Final   Influenza A H1 2009 NOT DETECTED NOT DETECTED Final   Influenza A H3 NOT DETECTED NOT DETECTED Final   Influenza B NOT DETECTED NOT DETECTED Final   Parainfluenza Virus 1 NOT DETECTED NOT DETECTED Final   Parainfluenza Virus 2 NOT DETECTED NOT DETECTED Final   Parainfluenza Virus 3 NOT DETECTED NOT DETECTED Final   Parainfluenza Virus 4 NOT DETECTED NOT DETECTED Final   Respiratory Syncytial Virus NOT DETECTED NOT DETECTED Final   Bordetella pertussis NOT DETECTED NOT DETECTED Final   Chlamydophila pneumoniae NOT DETECTED NOT DETECTED Final   Mycoplasma pneumoniae NOT DETECTED NOT DETECTED Final  Culture, sputum-assessment     Status: None   Collection Time: 04/28/16 11:50 AM  Result Value Ref Range Status   Specimen Description EXPECTORATED SPUTUM  Final   Special Requests NONE  Final   Sputum evaluation   Final    MICROSCOPIC FINDINGS SUGGEST THAT THIS SPECIMEN IS NOT REPRESENTATIVE OF LOWER RESPIRATORY SECRETIONS. PLEASE RECOLLECT. RESULT CALLED TO,  READ BACK BY AND VERIFIED WITH: G GLESSON 04/28/16 @ 94 M VESTAL    Report Status 04/28/2016 FINAL  Final  Acid Fast Smear (AFB)     Status: None   Collection Time: 04/28/16  1:55 PM  Result Value Ref Range Status   AFB Specimen Processing Concentration  Final   Acid Fast Smear Negative  Final    Comment: (NOTE) Performed At: [facility] [place], [place] [id number] Lindon Romp MD DD:[id number]    Source (AFB) BRONCHIAL ALVEOLAR LAVAGE  Final    Comment: POSTERIOR  Acid Fast Smear (AFB)     Status: None   Collection Time: 04/28/16  1:55 PM  Result Value Ref Range Status   AFB Specimen Processing Concentration  Final   Acid Fast Smear Negative  Final    Comment: (NOTE) Performed At: [facility] [place], [place] [id number] Lindon Romp MD VV:[id number]    Source (AFB) BRONCHIAL ALVEOLAR LAVAGE  Final    Comment: SUPERIOR  Culture, bal-quantitative     Status: None (Preliminary result)   Collection Time: 04/28/16  3:29 PM  Result Value Ref Range Status   Specimen Description BRONCHIAL ALVEOLAR LAVAGE  Final   Special Requests POSTERIOR SEGMENT  Final   Gram Stain   Final    MODERATE WBC PRESENT,BOTH PMN AND MONONUCLEAR NO ORGANISMS SEEN    Culture CULTURE REINCUBATED FOR BETTER GROWTH  Final   Report Status PENDING  Incomplete  Culture, bal-quantitative     Status: None (Preliminary result)   Collection Time: 04/28/16  3:30 PM  Result Value Ref Range Status   Specimen Description BRONCHIAL ALVEOLAR LAVAGE  Final   Special Requests SUPERIOR SEGMENT  Final   Gram Stain   Final    DEGENERATED CELLULAR MATERIAL PRESENT NO ORGANISMS SEEN    Culture  CULTURE REINCUBATED FOR BETTER GROWTH  Final   Report Status PENDING  Incomplete     Labs: BNP (last 3 results)  Recent Labs  04/25/16 0500  BNP 426.8*   Basic Metabolic Panel:  Recent Labs Lab 04/24/16 2036 04/25/16 1216 04/26/16 0334 04/27/16 0637  04/29/16 0551  NA 130* 133* 133* 134* 131*  K 3.2* 3.4* 3.1* 3.5 3.5  CL 96* 101 101 100* 95*  CO2 '24 26 24 27 29  '$ GLUCOSE 150* 98 86 96 92  BUN <5* 5* 5* <5* <5*  CREATININE 0.39* 0.43* 0.38* 0.35* 0.43*  CALCIUM 8.6* 8.4* 8.0* 8.0* 8.2*  MG 1.5* 2.0 1.7  --  1.7  PHOS  --   --  2.7  --   --    Liver Function Tests: No results for input(s): AST, ALT, ALKPHOS, BILITOT, PROT, ALBUMIN in the last 168 hours. No results for input(s): LIPASE, AMYLASE in the last 168 hours. No results for input(s): AMMONIA in the last 168 hours. CBC:  Recent Labs Lab 04/25/16 1216 04/26/16 0334 04/27/16 0637  WBC 18.0* 13.9* 13.3*  NEUTROABS 14.7* 10.0*  --   HGB 11.2* 11.2* 12.5  HCT 33.6* 34.3* 38.6  MCV 96.6 97.2 98.2  PLT 282 303 327   Cardiac Enzymes: No results for input(s): CKTOTAL, CKMB, CKMBINDEX, TROPONINI in the last 168 hours. BNP: Invalid input(s): POCBNP CBG: No results for input(s): GLUCAP in the last 168 hours. D-Dimer No results for input(s): DDIMER in the last 72 hours. Hgb A1c No results for input(s): HGBA1C in the last 72 hours. Lipid Profile No results for input(s): CHOL, HDL, LDLCALC, TRIG, CHOLHDL, LDLDIRECT in the last 72 hours. Thyroid function studies No results for input(s): TSH, T4TOTAL, T3FREE, THYROIDAB in the last 72 hours.  Invalid input(s): FREET3 Anemia work up No results for input(s): VITAMINB12, FOLATE, FERRITIN, TIBC, IRON, RETICCTPCT in the last 72 hours. Urinalysis No results found for: COLORURINE, APPEARANCEUR, LABSPEC, PHURINE, GLUCOSEU, HGBUR, BILIRUBINUR, KETONESUR, PROTEINUR, UROBILINOGEN, NITRITE, LEUKOCYTESUR Sepsis Labs Invalid input(s): PROCALCITONIN,  WBC,  LACTICIDVEN Microbiology Recent Results (from the past 240 hour(s))  Culture, blood (x 2)     Status: None   Collection Time: 04/24/16  8:36 PM  Result Value Ref Range Status   Specimen Description BLOOD RIGHT ANTECUBITAL  Final   Special Requests BOTTLES DRAWN AEROBIC ONLY 5CC   Final   Culture NO GROWTH 5 DAYS  Final   Report Status 04/29/2016 FINAL  Final  Culture, blood (x 2)     Status: None   Collection Time: 04/24/16  8:39 PM  Result Value Ref Range Status   Specimen Description BLOOD LEFT HAND  Final   Special Requests BOTTLES DRAWN AEROBIC AND ANAEROBIC 5CC  Final   Culture NO GROWTH 5 DAYS  Final   Report Status 04/29/2016 FINAL  Final  C difficile quick scan w PCR reflex     Status: None   Collection Time: 04/25/16 11:09 AM  Result Value Ref Range Status   C Diff antigen NEGATIVE NEGATIVE Final   C Diff toxin NEGATIVE NEGATIVE Final   C Diff interpretation Negative for toxigenic C. difficile  Final  Respiratory Panel by PCR     Status: None   Collection Time: 04/28/16 10:08 AM  Result Value Ref Range Status   Adenovirus NOT DETECTED NOT DETECTED Final   Coronavirus 229E NOT DETECTED NOT DETECTED Final   Coronavirus HKU1 NOT DETECTED NOT DETECTED Final   Coronavirus NL63 NOT DETECTED NOT DETECTED Final  Coronavirus OC43 NOT DETECTED NOT DETECTED Final   Metapneumovirus NOT DETECTED NOT DETECTED Final   Rhinovirus / Enterovirus NOT DETECTED NOT DETECTED Final   Influenza A NOT DETECTED NOT DETECTED Final   Influenza A H1 NOT DETECTED NOT DETECTED Final   Influenza A H1 2009 NOT DETECTED NOT DETECTED Final   Influenza A H3 NOT DETECTED NOT DETECTED Final   Influenza B NOT DETECTED NOT DETECTED Final   Parainfluenza Virus 1 NOT DETECTED NOT DETECTED Final   Parainfluenza Virus 2 NOT DETECTED NOT DETECTED Final   Parainfluenza Virus 3 NOT DETECTED NOT DETECTED Final   Parainfluenza Virus 4 NOT DETECTED NOT DETECTED Final   Respiratory Syncytial Virus NOT DETECTED NOT DETECTED Final   Bordetella pertussis NOT DETECTED NOT DETECTED Final   Chlamydophila pneumoniae NOT DETECTED NOT DETECTED Final   Mycoplasma pneumoniae NOT DETECTED NOT DETECTED Final  Culture, sputum-assessment     Status: None   Collection Time: 04/28/16 11:50 AM  Result Value  Ref Range Status   Specimen Description EXPECTORATED SPUTUM  Final   Special Requests NONE  Final   Sputum evaluation   Final    MICROSCOPIC FINDINGS SUGGEST THAT THIS SPECIMEN IS NOT REPRESENTATIVE OF LOWER RESPIRATORY SECRETIONS. PLEASE RECOLLECT. RESULT CALLED TO, READ BACK BY AND VERIFIED WITH: G GLESSON 04/28/16 @ 66 M VESTAL    Report Status 04/28/2016 FINAL  Final  Acid Fast Smear (AFB)     Status: None   Collection Time: 04/28/16  1:55 PM  Result Value Ref Range Status   AFB Specimen Processing Concentration  Final   Acid Fast Smear Negative  Final    Comment: (NOTE) Performed At: [facility] [place], [place] [id number] Lindon Romp MD ER:[id number]    Source (AFB) BRONCHIAL ALVEOLAR LAVAGE  Final    Comment: POSTERIOR  Acid Fast Smear (AFB)     Status: None   Collection Time: 04/28/16  1:55 PM  Result Value Ref Range Status   AFB Specimen Processing Concentration  Final   Acid Fast Smear Negative  Final    Comment: (NOTE) Performed At: [facility] [place], [place] [id number] Lindon Romp MD IW:[id number]    Source (AFB) BRONCHIAL ALVEOLAR LAVAGE  Final    Comment: SUPERIOR  Culture, bal-quantitative     Status: None (Preliminary result)   Collection Time: 04/28/16  3:29 PM  Result Value Ref Range Status   Specimen Description BRONCHIAL ALVEOLAR LAVAGE  Final   Special Requests POSTERIOR SEGMENT  Final   Gram Stain   Final    MODERATE WBC PRESENT,BOTH PMN AND MONONUCLEAR NO ORGANISMS SEEN    Culture CULTURE REINCUBATED FOR BETTER GROWTH  Final   Report Status PENDING  Incomplete  Culture, bal-quantitative     Status: None (Preliminary result)   Collection Time: 04/28/16  3:30 PM  Result Value Ref Range Status   Specimen Description BRONCHIAL ALVEOLAR LAVAGE  Final   Special Requests SUPERIOR SEGMENT  Final   Gram Stain   Final    DEGENERATED CELLULAR MATERIAL PRESENT NO ORGANISMS SEEN    Culture  CULTURE REINCUBATED FOR BETTER GROWTH  Final   Report Status PENDING  Incomplete     Time coordinating discharge: Over 30 minutes  SIGNED:   Hosie Poisson, MD  [facility] 04/30/2016, 11:20 PM Pager  [phone]  If 7PM-7AM, please contact night-coverage [email or web address] Password [email or web address]

## 2016-04-30 NOTE — Progress Notes (Signed)
[place] NOTE  Patient Care Team: Melony Overly, MD as PCP - General (Family Medicine)  CHIEF COMPLAINTS/PURPOSE OF CONSULTATION:  Non-small cell lung cancer  HISTORY OF PRESENTING ILLNESS:  Kylie Valencia 60 y.o. female is seen today because of recent diagnosis of non-small cell lung cancer. I review her records extensively and collaborated the history with the patient's. The patient has extensive history of recurrent pneumonia and infection over the past few months, at least since February 2017. I had the opportunity to review her outside records. She had CT scan which show consolidation/pneumonia in February 2017 and was prescribed antibiotics. Around April 2017, she had MRI brain done because of aneurysm and have interventional procedure. She did well. MRI did not show any intracranial metastasis. Recently, she was hospitalized again because of fever, cough, leukocytosis and abnormal outside CT scan. She was transferred here for further management. She was started on broad-spectrum IV antibiotics and underwent further evaluation. On 04/28/2016, she underwent virtual bronchoscopy, brushings and biopsy. Cytology from 04/28/2016 show a cluster of cells resembled non-small cell lung cancer, favor adenocarcinoma subtype. CT scan of the abdomen and pelvis dated 04/29/2016 show no evidence of distant metastatic disease. She continues to have cough with occasional hemoptysis. She had hemoptysis for the past few months. She also has some anorexia and mild weight loss. Denies changes in bowel habits. No focal neurological deficit or headaches. Denies recent bone pain. No new lymphadenopathy. The patient is a heavy smoker but has not smoked for almost a week. She is motivated to quit smoking with this diagnosis of cancer.  MEDICAL HISTORY:  Past Medical History  Diagnosis Date  . Hyperlipidemia     takes Lipitor nightly  . Depression     takes Citalopram nightly   . Peripheral vascular disease ([place]) 20+ yrs ago  . COPD (chronic obstructive pulmonary disease) ([facility])     Combivent as needed  . Smokers' cough ([place])   . Pneumonia Feb 14,2017  . Cavitary pneumonia 04/24/2016  . Pneumonia <2017     "several times" (04/23/2016)  . Stroke ([place]) 12/2015    denies residual on 04/24/2016  . Anxiety     SURGICAL HISTORY: Past Surgical History  Procedure Laterality Date  . Tubal ligation    . Aorto-femoral bypass graft    . Colonoscopy    . Radiology with anesthesia N/A 02/18/2016    Procedure: MRI OF BRAIN WITH AND WITHOUT CONTRAST, MRI OF CERVICAL SPINE;  Surgeon: Luanne Bras, MD;  Location: [facility];  Service: Radiology;  Laterality: N/A;  . Radiology with anesthesia N/A 03/16/2016    Procedure: EMBOLIZATION;  Surgeon: Luanne Bras, MD;  Location: [facility];  Service: Radiology;  Laterality: N/A;  . Video bronchoscopy Bilateral 04/28/2016    Procedure: VIDEO BRONCHOSCOPY WITHOUT FLUORO;  Surgeon: Rush Farmer, MD;  Location: [facility];  Service: Cardiopulmonary;  Laterality: Bilateral;    SOCIAL HISTORY: Social History   Social History  . Marital Status: Divorced    Spouse Name: N/A  . Number of Children: N/A  . Years of Education: N/A   Occupational History  . Not on file.   Social History Main Topics  . Smoking status: Current Every Day Smoker -- 0.50 packs/day for 45 years  . Smokeless tobacco: Never Used  . Alcohol Use: 12.6 oz/week    21 Cans of beer per week     Comment: 3 beers a day  . Drug Use: No  . Sexual Activity:  Not Currently   Other Topics Concern  . Not on file   Social History Narrative    FAMILY HISTORY: Family History  Problem Relation Age of Onset  . Adopted: Yes    ALLERGIES:  is allergic to penicillins.  MEDICATIONS:  Current Facility-Administered Medications  Medication Dose Route Frequency Provider Last Rate Last Dose  . albuterol (PROVENTIL) (2.5 MG/3ML) 0.083% nebulizer solution 2.5 mg  2.5 mg  Nebulization Q4H PRN Ivor Costa, MD   2.5 mg at 04/26/16 0630  . aspirin EC tablet 81 mg  81 mg Oral Daily Ivor Costa, MD   81 mg at 04/30/16 0926  . atorvastatin (LIPITOR) tablet 10 mg  10 mg Oral QHS Ivor Costa, MD   10 mg at 04/29/16 2100  . aztreonam (AZACTAM) 1 g in dextrose 5 % 50 mL IVPB  1 g Intravenous Q8H Ivor Costa, MD   1 g at 04/30/16 0522  . citalopram (CELEXA) tablet 20 mg  20 mg Oral QHS Ivor Costa, MD   20 mg at 04/29/16 2100  . citalopram (CELEXA) tablet 40 mg  40 mg Oral q morning - 10a Ivor Costa, MD   40 mg at 04/30/16 0925  . clopidogrel (PLAVIX) tablet 75 mg  75 mg Oral Daily Ivor Costa, MD   75 mg at 04/30/16 0926  . dextromethorphan-guaiFENesin (MUCINEX DM) 30-600 MG per 12 hr tablet 1 tablet  1 tablet Oral BID Ivor Costa, MD   1 tablet at 04/30/16 0926  . enoxaparin (LOVENOX) injection 40 mg  40 mg Subcutaneous Q24H Ivor Costa, MD   40 mg at 04/29/16 2101  . feeding supplement (ENSURE ENLIVE) (ENSURE ENLIVE) liquid 237 mL  237 mL Oral BID BM Ivor Costa, MD   237 mL at 04/28/16 1634  . folic acid (FOLVITE) tablet 1 mg  1 mg Oral Daily Ivor Costa, MD   1 mg at 04/30/16 0926  . metroNIDAZOLE (FLAGYL) tablet 500 mg  500 mg Oral Q8H Alexa R Quay Burow, MD   500 mg at 04/30/16 0629  . multivitamin with minerals tablet 1 tablet  1 tablet Oral Daily Ivor Costa, MD   1 tablet at 04/30/16 0926  . nicotine (NICODERM CQ - dosed in mg/24 hours) patch 21 mg  21 mg Transdermal Daily Ivor Costa, MD   21 mg at 04/24/16 2015  . thiamine (VITAMIN B-1) tablet 100 mg  100 mg Oral Daily Ivor Costa, MD   100 mg at 04/30/16 0926  . vancomycin (VANCOCIN) 1,250 mg in sodium chloride 0.9 % 250 mL IVPB  1,250 mg Intravenous Q12H Hosie Poisson, MD   1,250 mg at 04/30/16 0630    REVIEW OF SYSTEMS:   Constitutional: Denies fevers, chills or abnormal night sweats Eyes: Denies blurriness of vision, double vision or watery eyes Ears, nose, mouth, throat, and face: Denies mucositis or sore throat Cardiovascular: Denies  palpitation, chest discomfort or lower extremity swelling Gastrointestinal:  Denies nausea, heartburn or change in bowel habits Skin: Denies abnormal skin rashes Lymphatics: Denies new lymphadenopathy or easy bruising Neurological:Denies numbness, tingling or new weaknesses Behavioral/Psych: Mood is stable, no new changes  All other systems were reviewed with the patient and are negative.  PHYSICAL EXAMINATION: ECOG PERFORMANCE STATUS: 1 - Symptomatic but completely ambulatory  Filed Vitals:   04/29/16 2128 04/30/16 0631  BP: 155/64 143/73  Pulse: 81 88  Temp: 98 F (36.7 C) 98 F (36.7 C)  Resp: 18 18   Filed Weights   04/24/16 2000 04/28/16  1245  Weight: 113 lb 8.6 oz (51.5 kg) 113 lb (51.256 kg)    GENERAL:alert, no distress and comfortable SKIN: skin color, texture, turgor are normal, no rashes or significant lesions EYES: normal, conjunctiva are pink and non-injected, sclera clear OROPHARYNX:no exudate, no erythema and lips, buccal mucosa, and tongue normal  NECK: supple, thyroid normal size, non-tender, without nodularity LYMPH:  no palpable lymphadenopathy in the cervical, axillary or inguinal LUNGS: clear to auscultation and percussion with normal breathing effort HEART: regular rate & rhythm and no murmurs and no lower extremity edema ABDOMEN:abdomen soft, non-tender and normal bowel sounds Musculoskeletal:no cyanosis of digits and no clubbing  PSYCH: alert & oriented x 3 with fluent speech NEURO: no focal motor/sensory deficits  LABORATORY DATA:  I have reviewed the data as listed Lab Results  Component Value Date   WBC 13.3* 04/27/2016   HGB 12.5 04/27/2016   HCT 38.6 04/27/2016   MCV 98.2 04/27/2016   PLT 327 04/27/2016    Recent Labs  03/16/16 0658  04/26/16 0334 04/27/16 0637 04/29/16 0551  NA 132*  < > 133* 134* 131*  K 3.5  < > 3.1* 3.5 3.5  CL 98*  < > 101 100* 95*  CO2 23  < > '24 27 29  '$ GLUCOSE 98  < > 86 96 92  BUN 6  < > 5* <5* <5*   CREATININE 0.52  < > 0.38* 0.35* 0.43*  CALCIUM 8.2*  < > 8.0* 8.0* 8.2*  GFRNONAA >60  < > >60 >60 >60  GFRAA >60  < > >60 >60 >60  PROT 5.7*  --   --   --   --   ALBUMIN 2.9*  --   --   --   --   AST 13*  --   --   --   --   ALT 12*  --   --   --   --   ALKPHOS 59  --   --   --   --   BILITOT 0.5  --   --   --   --   < > = values in this interval not displayed.  RADIOGRAPHIC STUDIES: I have personally reviewed the radiological images as listed and agreed with the findings in the report. Ct Abdomen Pelvis W Contrast  04/29/2016  CLINICAL DATA:  Acute respiratory failure possibly from the Cavitary pneumonia: As showed by CT angiogram. Started on broad spectrum antibiotics. 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 CT abd andpelvis ordered to evaluate for metastases. h/o tubal ligation EXAM: CT ABDOMEN AND PELVIS WITH CONTRAST TECHNIQUE: Multidetector CT imaging of the abdomen and pelvis was performed using the standard protocol following bolus administration of intravenous contrast. CONTRAST:  117m ISOVUE-300 IOPAMIDOL (ISOVUE-300) INJECTION 61% COMPARISON:  Chest CT, 04/23/2016. FINDINGS: Lung bases: Cavitary mass for pneumonia in the left lung base is noted. There is more confluent opacification along the anterior aspect of the left lower lobe than it was on the earlier exam. Stable nodule in the right lower lobe. Heart normal in size. Hepatobiliary: No liver mass or lesion. Normal gallbladder. No bile duct dilation. Spleen: Unremarkable. Pancreas:  Unremarkable. Adrenal glands:  No masses. Kidneys, ureters, bladder: Somewhat wedge-shaped area of decreased attenuation and enhancement in the midpole the left kidney. This suggests an infarct. No other areas of abnormal  renal attenuation or enhancement. 1 mm  nonobstructing stone in the midpole the right kidney. There is mild bilateral renal collecting system dilation. The ureters are normal in course and in caliber. The bladder is unremarkable. Uterus and adnexa:  Unremarkable. Lymph nodes: There are sub cm calcified nodes along the gastrohepatic ligament. There are no pathologically enlarged lymph nodes. Ascites:  None. Gastrointestinal: Mild dilation of the transverse and right colon. No colonic wall thickening or adjacent inflammation. Normal appearance of the small bowel. Normal appendix visualized. Vascular: There are atherosclerotic changes along the abdominal aorta. There has been a previous aortobifemoral graft, which is patent. Musculoskeletal: No osteoblastic or osteolytic lesions. IMPRESSION: 1. No evidence of metastatic disease within the abdomen or pelvis. 2. Cavitary mass or pneumonia in the left lower lobe is similar to the prior study although there is more confluent opacity than there was on the prior exam mostly along the anterior left lower lobe. This may reflect worsening pneumonia or worsening post obstructive change. 3. There is a wedge-shaped area of hypoattenuation in decreased enhancement in the left kidney at the midpole suggesting an infarct. This is of unclear chronicity, but may be recent. This does not appear to be a mass. Infection is not excluded. 4. Aortic atherosclerosis. Electronically Signed   By: Lajean Manes M.D.   On: 04/29/2016 20:40   [facility] Radiologist Eval & Mgmt  04/21/2016  EXAM: ESTABLISHED PATIENT OFFICE VISIT CHIEF COMPLAINT: Chest and cold infection.  Pain with passing urine. She denies recent chills, fever or rigors. 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 The patient had an uneventful procedure. The patient did develop some  symptoms of breathing difficulties which responded to 2 albuterol inhalation treatments. The patient was then discharged and had a chest x-ray which revealed the presence of a left lower lobe consolidation probably inflammatory. The patient was then treated with antibiotics for about 7 days with improvement. However, she was then started on a short course of prednisone which significantly improved her breathing difficulties and improved her appetite. However, since she stopped taking the prednisone, the patient has developed continuous diarrhea which is watery and without any noticeable reddish discoloration. These are associated mild tummy aches. Her appetite is decreased and her weight loss is related to her lack of appetite and diarrhea. She continues to have a mild cough occasionally associated with phlegm production. However, she denies any hemoptysis. She also has symptoms of urgency of micturition, however, she predates that to her procedure and has been experiencing this for about 3-4 months. She denies any hematuria or dysuria. She has noticed, however, easy bruisability in her skin on her forearms and also her thighs. Her past medical history, previous surgical history, social history, family history as per her H and P for from 3 weeks ago, unchanged. Present medications: Aspirin 81 mg a day. Plavix 75 mg a day. Atorvastatin. Celexa. Combivent. Vitamin-D, Ergocalciferol. Allergies: Penicillins which causes anaphylaxis and shortness of breath. PHYSICAL EXAMINATION: In no acute distress. However, appears pale and thin. Affect normal. Neurological fully intact. ASSESSMENT AND PLAN: The patient's immediate post treatment arteriograms were reviewed with her and her friend. These depict placement of a pipeline flow diverter device with stasis within the endovascularly treated aneurysm involving the left internal carotid artery intracranially. Also noted was a chest x-ray performed which depicted the  presence of  a left lower lobe consolidation probably inflammatory. The plan today is to obtain a platelet inhibition study P2Y12. Additionally the patient has been advised to revisit with her primary care doctor for consideration of repeat chest x-ray if warranted. Also the patient has been advised to drink plenty of liquids because of her diarrhea and to review her symptoms of diarrhea with her primary care physician as quickly as possible. 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 Questions were answered to her satisfaction. The patient was asked to call should she have any concerns or questions. Electronically Signed   By: Luanne Bras M.D.   On: 04/17/2016 12:25    ASSESSMENT & PLAN:  Non-small cell lung cancer, clinical T3N2 possible M1 (separate nodule in the contralateral lung) Heavy smoking prior to diagnosis Hyponatremia Compression atelectasis secondary to cancer versus pneumonia  I have a long discussion with the patient and staging. I am concerned about a new small right lower lobe nodule which appeared to be new since her prior imaging in February 2017. Infection or metastasis cannot be excluded. I am not sure if PET CT scan will add significant value but certainly could be done as an outpatient. She had recent MRI of the brain in April which was negative for intracranial metastasis. We discussed treatment options. She would probably benefit from, current chemoradiation treatment as an outpatient. I explained to the patient she is not a surgical candidate due to proximity to the mediastinal blood vessel The patient would like to be referred to [facility] as it is closer to her home for further discussion and treatment option.  Treatment is strictly palliative in nature. I did not  go into much details about side effects of treatment. I will get my assistant to make referral to [facility]. The patient is advised to quit smoking and she appeared to be motivated I have spoken with the primary hospitalist regarding discharge planning. I will sign off. The patient will be contacted directly to be seen at [facility] hopefully within the next week or so.   All questions were answered. The patient knows to call the clinic with any problems, questions or concerns. I spent 30 minutes counseling the patient face to face. The total time spent in the appointment was 60 minutes and more than 50% was on counseling.     [place], [place], MD 04/30/2016 9:45 AM

## 2016-04-30 NOTE — Progress Notes (Signed)
Renaldo Reel to be D/C'd to home per MD order.  Discussed with the patient and all questions fully answered.  VSS, Skin clean, dry and intact without evidence of skin break down, no evidence of skin tears noted. IV catheter discontinued intact. Site without signs and symptoms of complications. Dressing and pressure applied.  An After Visit Summary was printed and given to the patient. Patient received prescription.  D/c education completed with patient/family including follow up instructions, medication list, d/c activities limitations if indicated, with other d/c instructions as indicated by MD - patient able to verbalize understanding, all questions fully answered.   Patient instructed to return to ED, call 911, or call MD for any changes in condition.   Patient escorted via [place], and D/C home via private auto.  Morley Kos Price 04/30/2016 4:05 PM

## 2016-05-01 DIAGNOSIS — Z889 Allergy status to unspecified drugs, medicaments and biological substances status: Secondary | ICD-10-CM | POA: Insufficient documentation

## 2016-05-01 LAB — CULTURE, BAL-QUANTITATIVE: CULTURE: NORMAL — AB

## 2016-05-01 LAB — CULTURE, BAL-QUANTITATIVE W GRAM STAIN: Culture: 40000 — AB

## 2016-05-04 ENCOUNTER — Telehealth: Payer: Self-pay | Admitting: Hematology and Oncology

## 2016-05-04 LAB — ASPERGILLUS ANTIGEN, BAL/SERUM: Aspergillus Ag, BAL/Serum: 0.06 Index (ref 0.00–0.49)

## 2016-05-04 NOTE — Telephone Encounter (Signed)
Faxed pt records to [facility]

## 2016-05-11 DIAGNOSIS — Z720 Tobacco use: Secondary | ICD-10-CM

## 2016-05-11 DIAGNOSIS — C3432 Malignant neoplasm of lower lobe, left bronchus or lung: Secondary | ICD-10-CM

## 2016-05-21 DIAGNOSIS — C7931 Secondary malignant neoplasm of brain: Secondary | ICD-10-CM | POA: Diagnosis not present

## 2016-05-21 DIAGNOSIS — R0602 Shortness of breath: Secondary | ICD-10-CM | POA: Diagnosis not present

## 2016-05-21 DIAGNOSIS — C3432 Malignant neoplasm of lower lobe, left bronchus or lung: Secondary | ICD-10-CM | POA: Diagnosis not present

## 2016-06-05 DIAGNOSIS — C3432 Malignant neoplasm of lower lobe, left bronchus or lung: Secondary | ICD-10-CM | POA: Diagnosis not present

## 2016-06-05 DIAGNOSIS — C7931 Secondary malignant neoplasm of brain: Secondary | ICD-10-CM | POA: Diagnosis not present

## 2016-06-08 DIAGNOSIS — C3490 Malignant neoplasm of unspecified part of unspecified bronchus or lung: Secondary | ICD-10-CM | POA: Diagnosis not present

## 2016-06-08 DIAGNOSIS — J9601 Acute respiratory failure with hypoxia: Secondary | ICD-10-CM

## 2016-06-08 DIAGNOSIS — J189 Pneumonia, unspecified organism: Secondary | ICD-10-CM | POA: Diagnosis not present

## 2016-06-09 DIAGNOSIS — C3432 Malignant neoplasm of lower lobe, left bronchus or lung: Secondary | ICD-10-CM | POA: Diagnosis not present

## 2016-06-09 DIAGNOSIS — J9601 Acute respiratory failure with hypoxia: Secondary | ICD-10-CM | POA: Diagnosis not present

## 2016-06-09 DIAGNOSIS — C3490 Malignant neoplasm of unspecified part of unspecified bronchus or lung: Secondary | ICD-10-CM | POA: Diagnosis not present

## 2016-06-09 DIAGNOSIS — J189 Pneumonia, unspecified organism: Secondary | ICD-10-CM | POA: Diagnosis not present

## 2016-06-10 DIAGNOSIS — J189 Pneumonia, unspecified organism: Secondary | ICD-10-CM | POA: Diagnosis not present

## 2016-06-10 DIAGNOSIS — C3490 Malignant neoplasm of unspecified part of unspecified bronchus or lung: Secondary | ICD-10-CM

## 2016-06-10 DIAGNOSIS — J9601 Acute respiratory failure with hypoxia: Secondary | ICD-10-CM | POA: Diagnosis not present

## 2016-06-11 DIAGNOSIS — C3490 Malignant neoplasm of unspecified part of unspecified bronchus or lung: Secondary | ICD-10-CM | POA: Diagnosis not present

## 2016-06-11 DIAGNOSIS — J189 Pneumonia, unspecified organism: Secondary | ICD-10-CM | POA: Diagnosis not present

## 2016-06-11 DIAGNOSIS — J9601 Acute respiratory failure with hypoxia: Secondary | ICD-10-CM | POA: Diagnosis not present

## 2016-06-11 LAB — ACID FAST CULTURE WITH REFLEXED SENSITIVITIES (MYCOBACTERIA)
Acid Fast Culture: NEGATIVE
Acid Fast Culture: NEGATIVE

## 2016-06-11 LAB — ACID FAST CULTURE WITH REFLEXED SENSITIVITIES

## 2016-06-12 DIAGNOSIS — C3490 Malignant neoplasm of unspecified part of unspecified bronchus or lung: Secondary | ICD-10-CM | POA: Diagnosis not present

## 2016-06-12 DIAGNOSIS — J9601 Acute respiratory failure with hypoxia: Secondary | ICD-10-CM | POA: Diagnosis not present

## 2016-06-12 DIAGNOSIS — J189 Pneumonia, unspecified organism: Secondary | ICD-10-CM | POA: Diagnosis not present

## 2016-06-13 DIAGNOSIS — J189 Pneumonia, unspecified organism: Secondary | ICD-10-CM | POA: Diagnosis not present

## 2016-06-13 DIAGNOSIS — J9601 Acute respiratory failure with hypoxia: Secondary | ICD-10-CM | POA: Diagnosis not present

## 2016-06-13 DIAGNOSIS — C3490 Malignant neoplasm of unspecified part of unspecified bronchus or lung: Secondary | ICD-10-CM | POA: Diagnosis not present

## 2016-06-13 DIAGNOSIS — C3432 Malignant neoplasm of lower lobe, left bronchus or lung: Secondary | ICD-10-CM | POA: Diagnosis not present

## 2016-06-14 DIAGNOSIS — J9601 Acute respiratory failure with hypoxia: Secondary | ICD-10-CM | POA: Diagnosis not present

## 2016-06-14 DIAGNOSIS — C3490 Malignant neoplasm of unspecified part of unspecified bronchus or lung: Secondary | ICD-10-CM | POA: Diagnosis not present

## 2016-06-14 DIAGNOSIS — J189 Pneumonia, unspecified organism: Secondary | ICD-10-CM | POA: Diagnosis not present

## 2016-06-15 DIAGNOSIS — J189 Pneumonia, unspecified organism: Secondary | ICD-10-CM | POA: Diagnosis not present

## 2016-06-15 DIAGNOSIS — J9601 Acute respiratory failure with hypoxia: Secondary | ICD-10-CM | POA: Diagnosis not present

## 2016-06-15 DIAGNOSIS — C3490 Malignant neoplasm of unspecified part of unspecified bronchus or lung: Secondary | ICD-10-CM | POA: Diagnosis not present

## 2016-06-16 DIAGNOSIS — J189 Pneumonia, unspecified organism: Secondary | ICD-10-CM | POA: Diagnosis not present

## 2016-06-16 DIAGNOSIS — J9601 Acute respiratory failure with hypoxia: Secondary | ICD-10-CM | POA: Diagnosis not present

## 2016-06-16 DIAGNOSIS — C3490 Malignant neoplasm of unspecified part of unspecified bronchus or lung: Secondary | ICD-10-CM | POA: Diagnosis not present

## 2016-06-18 DIAGNOSIS — J9600 Acute respiratory failure, unspecified whether with hypoxia or hypercapnia: Secondary | ICD-10-CM

## 2016-06-18 DIAGNOSIS — C3490 Malignant neoplasm of unspecified part of unspecified bronchus or lung: Secondary | ICD-10-CM

## 2016-06-18 DIAGNOSIS — J90 Pleural effusion, not elsewhere classified: Secondary | ICD-10-CM

## 2016-06-18 DIAGNOSIS — J189 Pneumonia, unspecified organism: Secondary | ICD-10-CM

## 2016-06-18 DIAGNOSIS — D72829 Elevated white blood cell count, unspecified: Secondary | ICD-10-CM

## 2016-06-18 DIAGNOSIS — J9601 Acute respiratory failure with hypoxia: Secondary | ICD-10-CM

## 2016-06-18 DIAGNOSIS — I639 Cerebral infarction, unspecified: Secondary | ICD-10-CM

## 2016-07-06 DIAGNOSIS — C3432 Malignant neoplasm of lower lobe, left bronchus or lung: Secondary | ICD-10-CM

## 2016-07-06 DIAGNOSIS — C7931 Secondary malignant neoplasm of brain: Secondary | ICD-10-CM

## 2016-07-31 DIAGNOSIS — C7931 Secondary malignant neoplasm of brain: Secondary | ICD-10-CM | POA: Diagnosis not present

## 2016-07-31 DIAGNOSIS — C3432 Malignant neoplasm of lower lobe, left bronchus or lung: Secondary | ICD-10-CM

## 2016-08-21 DIAGNOSIS — R590 Localized enlarged lymph nodes: Secondary | ICD-10-CM | POA: Diagnosis not present

## 2016-08-21 DIAGNOSIS — C3432 Malignant neoplasm of lower lobe, left bronchus or lung: Secondary | ICD-10-CM

## 2016-08-21 DIAGNOSIS — J90 Pleural effusion, not elsewhere classified: Secondary | ICD-10-CM | POA: Diagnosis not present

## 2016-09-11 DIAGNOSIS — C3432 Malignant neoplasm of lower lobe, left bronchus or lung: Secondary | ICD-10-CM | POA: Diagnosis not present

## 2016-09-11 DIAGNOSIS — C7931 Secondary malignant neoplasm of brain: Secondary | ICD-10-CM | POA: Diagnosis not present

## 2016-10-23 DIAGNOSIS — E860 Dehydration: Secondary | ICD-10-CM | POA: Diagnosis not present

## 2016-10-23 DIAGNOSIS — R197 Diarrhea, unspecified: Secondary | ICD-10-CM | POA: Diagnosis not present

## 2016-10-23 DIAGNOSIS — C3432 Malignant neoplasm of lower lobe, left bronchus or lung: Secondary | ICD-10-CM | POA: Diagnosis not present

## 2016-11-03 DIAGNOSIS — E860 Dehydration: Secondary | ICD-10-CM | POA: Diagnosis not present

## 2016-11-03 DIAGNOSIS — E876 Hypokalemia: Secondary | ICD-10-CM

## 2016-11-03 DIAGNOSIS — A0472 Enterocolitis due to Clostridium difficile, not specified as recurrent: Secondary | ICD-10-CM | POA: Diagnosis not present

## 2016-11-03 DIAGNOSIS — C3490 Malignant neoplasm of unspecified part of unspecified bronchus or lung: Secondary | ICD-10-CM

## 2016-11-03 DIAGNOSIS — R112 Nausea with vomiting, unspecified: Secondary | ICD-10-CM | POA: Diagnosis not present

## 2016-11-04 DIAGNOSIS — R112 Nausea with vomiting, unspecified: Secondary | ICD-10-CM | POA: Diagnosis not present

## 2016-11-04 DIAGNOSIS — E860 Dehydration: Secondary | ICD-10-CM | POA: Diagnosis not present

## 2016-11-04 DIAGNOSIS — A0472 Enterocolitis due to Clostridium difficile, not specified as recurrent: Secondary | ICD-10-CM | POA: Diagnosis not present

## 2016-11-04 DIAGNOSIS — E876 Hypokalemia: Secondary | ICD-10-CM | POA: Diagnosis not present

## 2016-11-05 DIAGNOSIS — A0472 Enterocolitis due to Clostridium difficile, not specified as recurrent: Secondary | ICD-10-CM | POA: Diagnosis not present

## 2016-11-05 DIAGNOSIS — E876 Hypokalemia: Secondary | ICD-10-CM | POA: Diagnosis not present

## 2016-11-05 DIAGNOSIS — R112 Nausea with vomiting, unspecified: Secondary | ICD-10-CM | POA: Diagnosis not present

## 2016-11-05 DIAGNOSIS — E860 Dehydration: Secondary | ICD-10-CM | POA: Diagnosis not present

## 2016-11-06 DIAGNOSIS — A0472 Enterocolitis due to Clostridium difficile, not specified as recurrent: Secondary | ICD-10-CM | POA: Diagnosis not present

## 2016-11-06 DIAGNOSIS — R112 Nausea with vomiting, unspecified: Secondary | ICD-10-CM | POA: Diagnosis not present

## 2016-11-06 DIAGNOSIS — E860 Dehydration: Secondary | ICD-10-CM | POA: Diagnosis not present

## 2016-11-06 DIAGNOSIS — E876 Hypokalemia: Secondary | ICD-10-CM | POA: Diagnosis not present

## 2016-11-09 DIAGNOSIS — C3490 Malignant neoplasm of unspecified part of unspecified bronchus or lung: Secondary | ICD-10-CM

## 2016-11-09 DIAGNOSIS — R638 Other symptoms and signs concerning food and fluid intake: Secondary | ICD-10-CM

## 2016-11-09 DIAGNOSIS — R112 Nausea with vomiting, unspecified: Secondary | ICD-10-CM

## 2016-11-09 DIAGNOSIS — R109 Unspecified abdominal pain: Secondary | ICD-10-CM | POA: Diagnosis not present

## 2016-11-09 DIAGNOSIS — E871 Hypo-osmolality and hyponatremia: Secondary | ICD-10-CM

## 2016-11-09 DIAGNOSIS — A0472 Enterocolitis due to Clostridium difficile, not specified as recurrent: Secondary | ICD-10-CM

## 2016-11-10 DIAGNOSIS — R109 Unspecified abdominal pain: Secondary | ICD-10-CM | POA: Diagnosis not present

## 2016-11-10 DIAGNOSIS — E871 Hypo-osmolality and hyponatremia: Secondary | ICD-10-CM | POA: Diagnosis not present

## 2016-11-10 DIAGNOSIS — A0472 Enterocolitis due to Clostridium difficile, not specified as recurrent: Secondary | ICD-10-CM | POA: Diagnosis not present

## 2016-11-10 DIAGNOSIS — R638 Other symptoms and signs concerning food and fluid intake: Secondary | ICD-10-CM | POA: Diagnosis not present

## 2016-11-11 DIAGNOSIS — C3490 Malignant neoplasm of unspecified part of unspecified bronchus or lung: Secondary | ICD-10-CM

## 2016-11-11 DIAGNOSIS — R109 Unspecified abdominal pain: Secondary | ICD-10-CM | POA: Diagnosis not present

## 2016-11-11 DIAGNOSIS — A0472 Enterocolitis due to Clostridium difficile, not specified as recurrent: Secondary | ICD-10-CM

## 2016-11-11 DIAGNOSIS — E871 Hypo-osmolality and hyponatremia: Secondary | ICD-10-CM

## 2016-11-11 DIAGNOSIS — R112 Nausea with vomiting, unspecified: Secondary | ICD-10-CM

## 2016-11-11 DIAGNOSIS — R638 Other symptoms and signs concerning food and fluid intake: Secondary | ICD-10-CM

## 2016-11-12 DIAGNOSIS — C3490 Malignant neoplasm of unspecified part of unspecified bronchus or lung: Secondary | ICD-10-CM

## 2016-11-12 DIAGNOSIS — R638 Other symptoms and signs concerning food and fluid intake: Secondary | ICD-10-CM

## 2016-11-12 DIAGNOSIS — R1110 Vomiting, unspecified: Secondary | ICD-10-CM

## 2016-11-12 DIAGNOSIS — R601 Generalized edema: Secondary | ICD-10-CM

## 2016-11-12 DIAGNOSIS — R109 Unspecified abdominal pain: Secondary | ICD-10-CM | POA: Diagnosis not present

## 2016-11-12 DIAGNOSIS — A0472 Enterocolitis due to Clostridium difficile, not specified as recurrent: Secondary | ICD-10-CM

## 2016-11-12 DIAGNOSIS — K2970 Gastritis, unspecified, without bleeding: Secondary | ICD-10-CM

## 2016-11-12 IMAGING — XA IR ANGIO/EXISTING CATHETER
1 series · 1 of 1 positions shown · IV contrast (IODINE)
Comparison: none

INDICATION: Headaches. Passing out spells. History of intracranial aneurysm and
cervical dural AV fistula.

[Series 6: carotid · 1 of 1 slices shown]
[im 1/1]
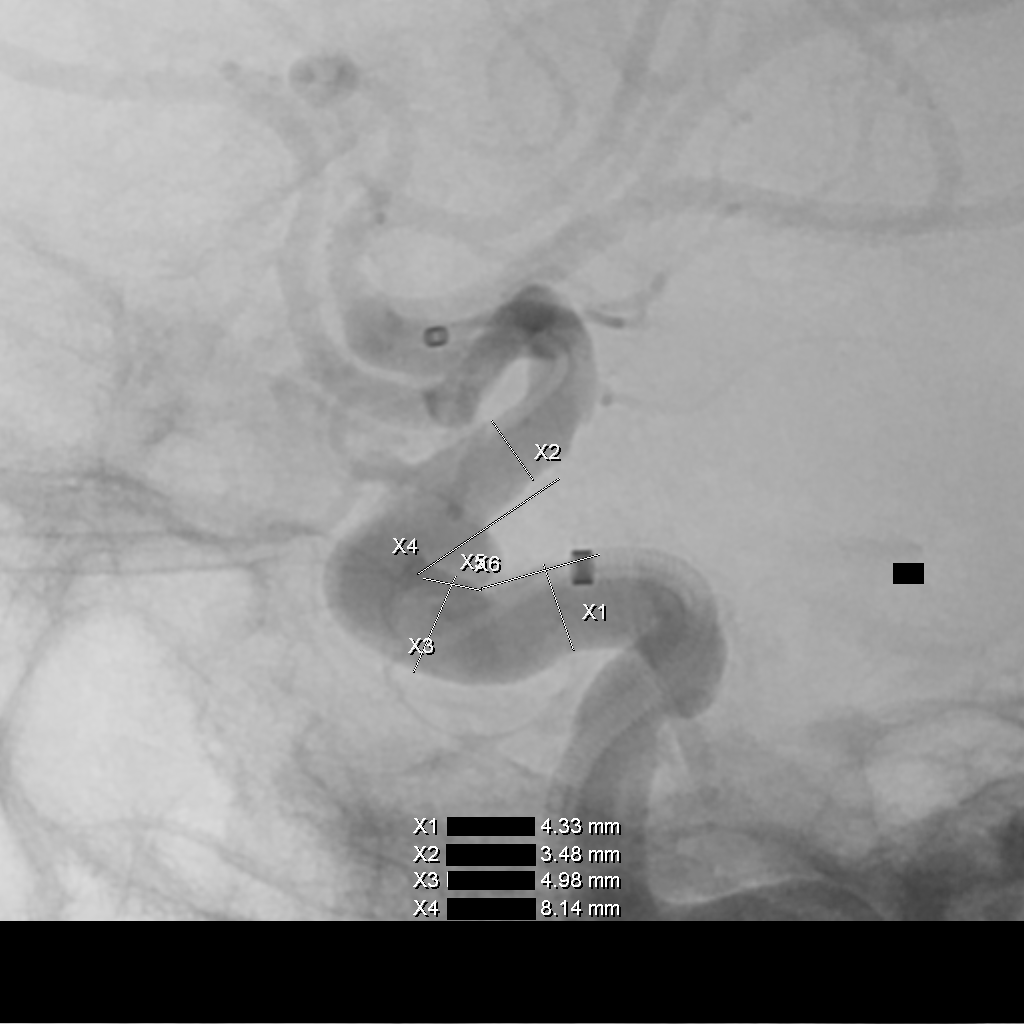

[1 of 1 positions shown; findings below may reference images not displayed]

EXAM:
TRANSCATHETER THERAPY EMBOLIZATION OF LEFT INTERNAL CAROTID ARTERY
SUPERIOR HYPOPHYSEAL REGION ANEURYSM:

MEDICATIONS:
2 g Ancef. The antibiotic was administered within 1 hour of the
procedure

ANESTHESIA/SEDATION:
General anesthesia.

CONTRAST:  Isovue 300 approximately 50 mL.

FLUOROSCOPY TIME:  Fluoroscopy Time: 33 minutes 54 seconds (1,700
mGy).

COMPLICATIONS:
None immediate.



Following a full explanation of the procedure along with the
potential associated complications, an informed witnessed consent
was obtained from the patient. The procedure, the risks, the
benefits were all again reviewed in detail.

After informed consent, the patient was put under general anesthesia
by the [REDACTED] at [HOSPITAL].

The right groin was prepped and draped in the usual sterile fashion.
Thereafter using modified Seldinger technique, transfemoral access
into the right common femoral graft was obtained without difficulty.
Over a 0.035 inch guidewire, a 5 French Pinnacle sheath was
inserted. Through this, and also over a 0.035 inch guidewire a 5
French JB 1 catheter was advanced to the aortic arch region and
selectively positioned in the left common carotid artery. An
arteriogram was then perfor[REDACTED]ed extracranially and
intracranially.

The patient tolerated the procedure well.
FINDINGS: The left common carotid arteriogram demonstrates the left external
carotid artery and its major branches to be widely patent.

Left internal carotid artery at the bulb to the cranial skull base
is seen to opacify normally.

The petrous, cavernous and the supraclinoid segments are again seen
to be widely patent.

Again demonstrated is a bilobed wide neck saccular aneurysm arising
from the superior hypophyseal region of the left internal carotid
artery. The small outpouching at the petrous cavernous junction
again remains unchanged.

The supraclinoid segment is normally opacified.

The left middle and the left anterior cerebral arteries opacify
normally into the capillary and the venous phases.

ENDOVASCULAR TREATMENT OF LEFT INTERNAL CAROTID ARTERY SUPERIOR
HYPOPHYSEAL REGION INTRACRANIAL ANEURYSM:

The diagnostic JB 1 catheter in the left common carotid artery was
exchanged over a 0.035 inch 300 cm Rosen exchange guidewire for a 6
French 80 cm Yaki Melchor using biplane roadmap technique and
constant fluoroscopic guidance. Good aspiration was obtained from
the side port of the Tuohy-Borst at the hub of the 6 Ogundepo Dunsin
Jerson David Gulloso. A gentle contrast injection demonstrated no evidence
spasms, dissections or of intraluminal filling defects.

The exchange guidewire was removed. Over a 0.035 inch Roadrunner
guidewire using biplane roadmap technique and constant fluoroscopic
guidance, a 115 cm 5 French Navien guide catheter was then advanced
to the distal cervical petrous junction of the left internal carotid
artery. The guidewire was removed. Good aspiration was obtained from
the hub of the 5 French Navien guide catheter. A gentle contrast
injection demonstrated no evidence of spasms dissections or of
intraluminal filling defects.

A control arteriogram perfor[REDACTED]ed intracranially demonstrated
no angiographic changes.

Using a 5 French Navien guide catheter as a reference, measurements
were then performed of the left internal carotid artery distal to
the aneurysm and proximal to the aneurysm at the chosen landing
zones.

At this time the 6 Davida Tiger was then advanced into
the distal cervical left ICA. Using biplane roadmap technique and
constant fluoroscopic guidance, in a coaxial manner and with
constant heparinized saline infusion, a Phenom 150 cm 027
microcatheter was then advanced over a 0.014 inch Softip Synchro
micro guidewire to the distal end of the Navien guide catheter. With
the micro guidewire leading with a J-tip, configuration, the
combination was then navigated with the micro guidewire leading to
the inferior division of the left middle cerebral artery M1-M2
junction. The guidewire was removed. Good aspiration was obtained
from the hub of the microcatheter. This was then connected to
continuous heparinized saline infusion. It was decided to use a
x 16 mm pipeline flex flow diverter device. This was prepped and
purged with heparinized saline infusion before advancing it in a
coaxial manner and with constant heparinized saline infusion using
biplane roadmap technique and constant fluoroscopic guidance to the
distal end of the microcatheter.

The O rings on the delivery microcatheter and the delivery device
micro guidewire were then straightened. The distal portion of the
wire on the delivery device was then exposed by retrieving the
delivery microcatheter. Atleast the distal 5 mm of the device were
also exposed. Using a slightly pushing technique on the micro
guidewire, the distal end of the device was then completely opened.

The combination was then gently retrieved to where the distal
landing zone of the device was planned.

One there, using the fluffing technique with the delivery of the
device by advancing the micro guidewire, and straightening the axis
of the delivery microcatheter into the center of the intraluminal
diameter of the parent vessel, the device was then deployed without
difficulty.

Once deployed, a control arteriogram performed through the 5 French
Navien guide catheter demonstrated excellent apposition distally and
proximally.

There was a small sliver of contrast noted along the superior aspect
of the proximal portion. This prompted the advancement of the
microcatheter through the micro guidewire to distal end into the
left middle cerebral artery. The micro guidewire was then removed.

At this time, 0.014 inch Softip Synchro micro guidewire with a J
configuration was then advanced to the distal end of the
microcatheter. The microcatheter was then maintained proximally
within the device followed by a to and fro motion of the micro
guidewire to encourage further expansion of the device at its
proximal aspect. Significant improvement of this was noted.

The micro guidewire was then removed. Control arteriogram was
performed at [DATE] and 40 minutes post device placement. It
continued to demonstrate excellent apposition and flow through the
device. Stasis of contrast was noted within the distal portion of
the aneurysm.

No evidence of intraluminal filling defects were noted within the
device or intracranially.

A final control arteriogram performed through the 5 French Navien
guide catheter in the left internal carotid artery demonstrated
excellent apposition and flow through the device without evidence of
intraluminal filling defects. More distally intracranially no
filling defects were noted or occlusions.

The 5 French Navien guide catheter and the 6 Smile Chung
Kaumi were then retrieved into the abdominal aorta and exchanged
over a J-tip guidewire for a 6 French Pinnacle sheath. This in turn
was then removed with hand compression in the right groin over the
graft.

During the procedure, the patient's neurological status and
hemodynamic status remained stable.

Following the removal of the 6 French sheath, the distal pulses in
the right foot remained 1+ unchanged compared to prior to the
examination.

During the procedure, the patient's ACT was maintained in the region
of approximately 200 seconds. Following manual compression for about
20 minutes, the patient's general anesthesia was reversed and the
patient was extubated. Upon recovery the patient demonstrated no new
neurological signs or symptoms. She did not complain of any chest
pains or shortness of breath.

She was then transported to the neuro ICU for overnight
observations. IV heparin was continued overnight. Close monitoring
of her blood pressure was also performed. Toward the latter end of
the evening, the patient was able to tolerate free liquids and
crackers.

The following day the patient's IV heparin was stopped. The patient
was started on aspirin 325 mg a day and Plavix 75 mg a day. The
right groin appears soft with good distal peripheral pulses the
following morning.

The patient did have symptoms of bronchospasm which necessitated the
use of a breathing treatment. The patient was started on her regular
inhalers with significant improvement in her O2 saturations in the
90s on room air prior to discharge. She was able to eat a normal
meal and ambulate independently. She was then discharged home under
the care of her family with special instructions.

She will be followed in the clinic in 2 weeks time. Patient was
advised strongly to maintain good hydration. They were asked to call
should she have any concerns or questions.
IMPRESSION: IMPRESSION
Post endovascular treatment of bilobed wide neck left internal
carotid artery superior hypophyeal region intracranial aneurysm
using the pipeline flex flow diverter device as described.

## 2016-11-13 DIAGNOSIS — K2970 Gastritis, unspecified, without bleeding: Secondary | ICD-10-CM | POA: Diagnosis not present

## 2016-11-13 DIAGNOSIS — R630 Anorexia: Secondary | ICD-10-CM

## 2016-11-13 DIAGNOSIS — A0472 Enterocolitis due to Clostridium difficile, not specified as recurrent: Secondary | ICD-10-CM | POA: Diagnosis not present

## 2016-11-13 DIAGNOSIS — R109 Unspecified abdominal pain: Secondary | ICD-10-CM | POA: Diagnosis not present

## 2016-11-13 DIAGNOSIS — R638 Other symptoms and signs concerning food and fluid intake: Secondary | ICD-10-CM | POA: Diagnosis not present

## 2016-11-13 DIAGNOSIS — C3432 Malignant neoplasm of lower lobe, left bronchus or lung: Secondary | ICD-10-CM

## 2016-11-13 IMAGING — CR DG CHEST 2V
2 series · 2 of 2 positions shown · non-contrast
Comparison: 02/18/2016

CLINICAL DATA: Shortness of Breath

EXAM:
CHEST  2 VIEW

[chest lat]
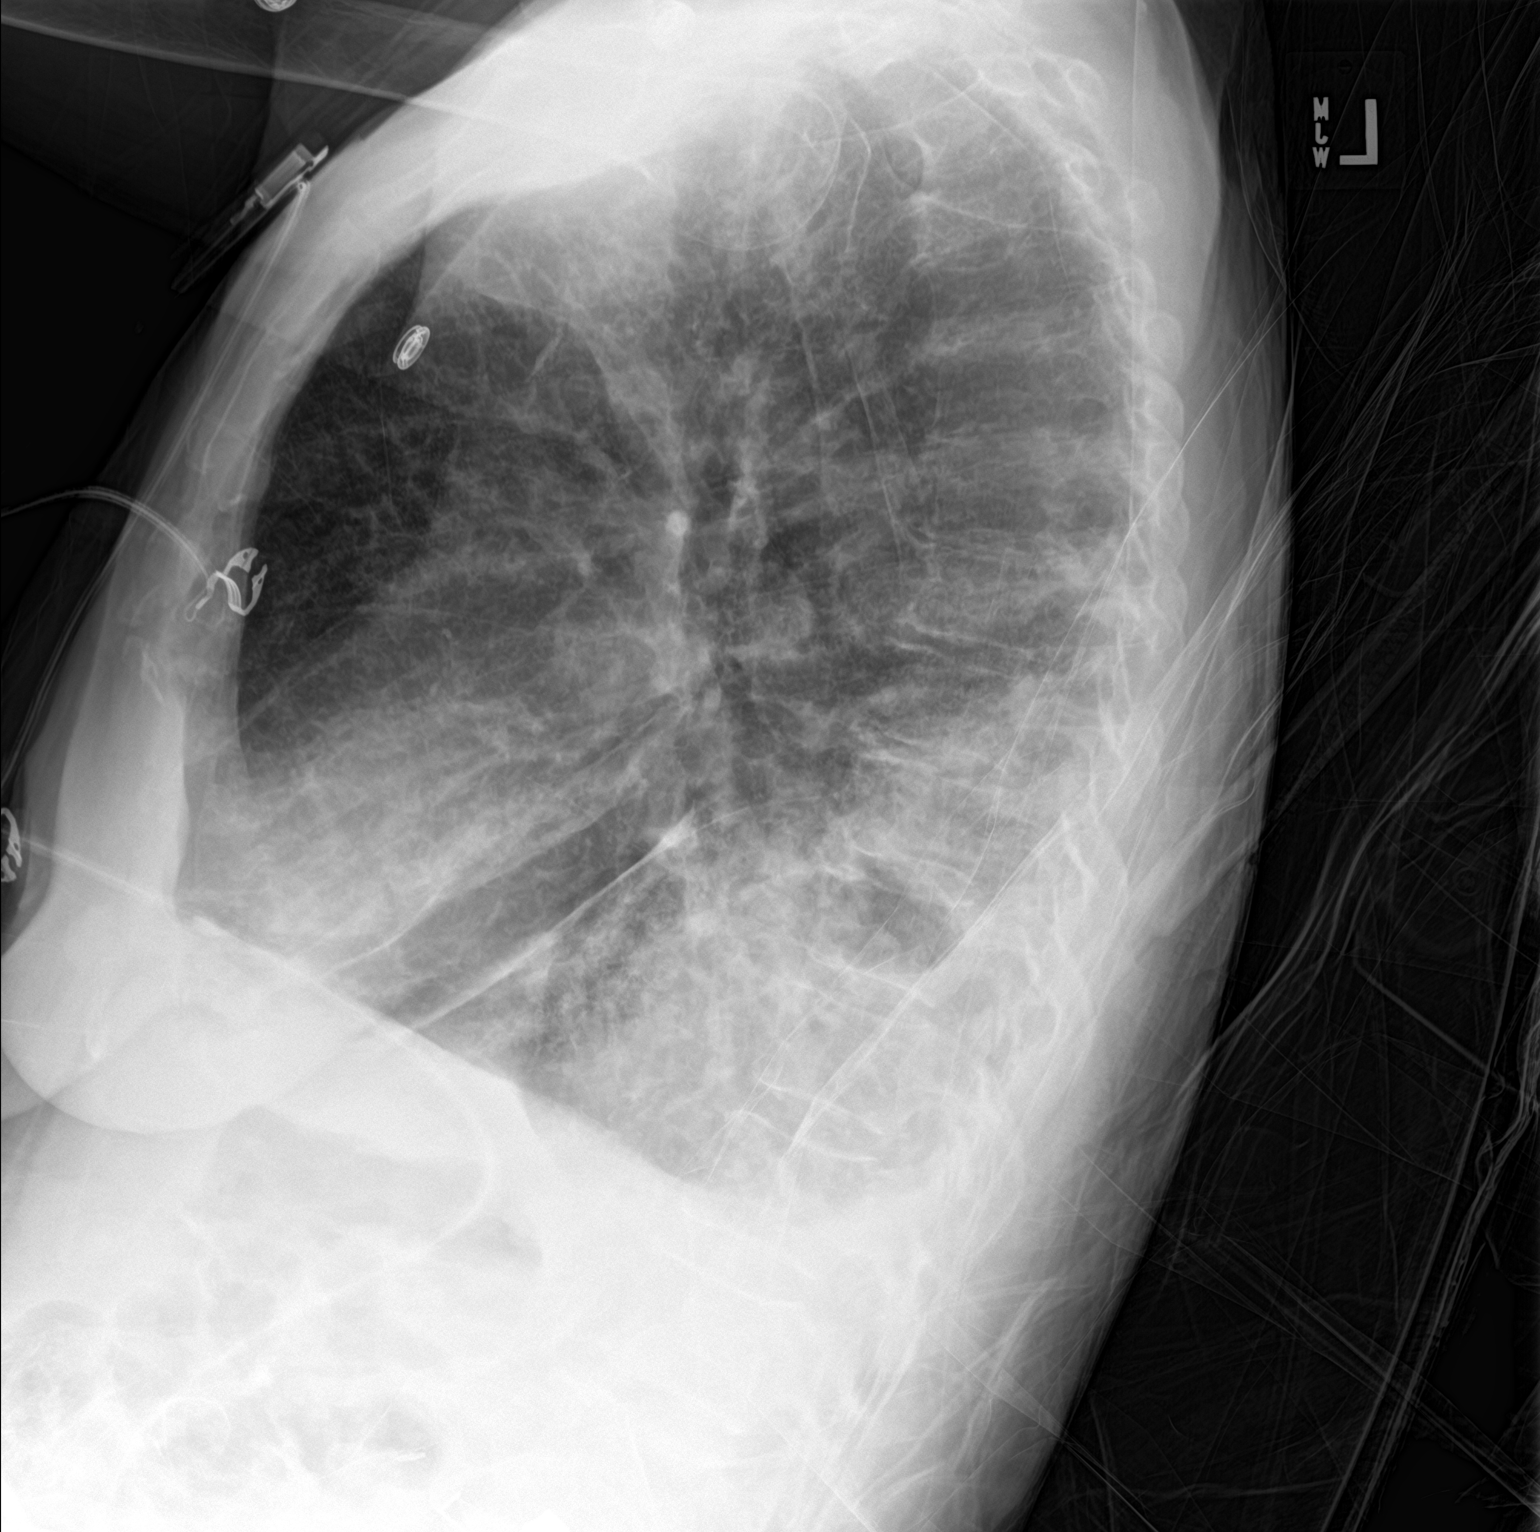

[chest ap]
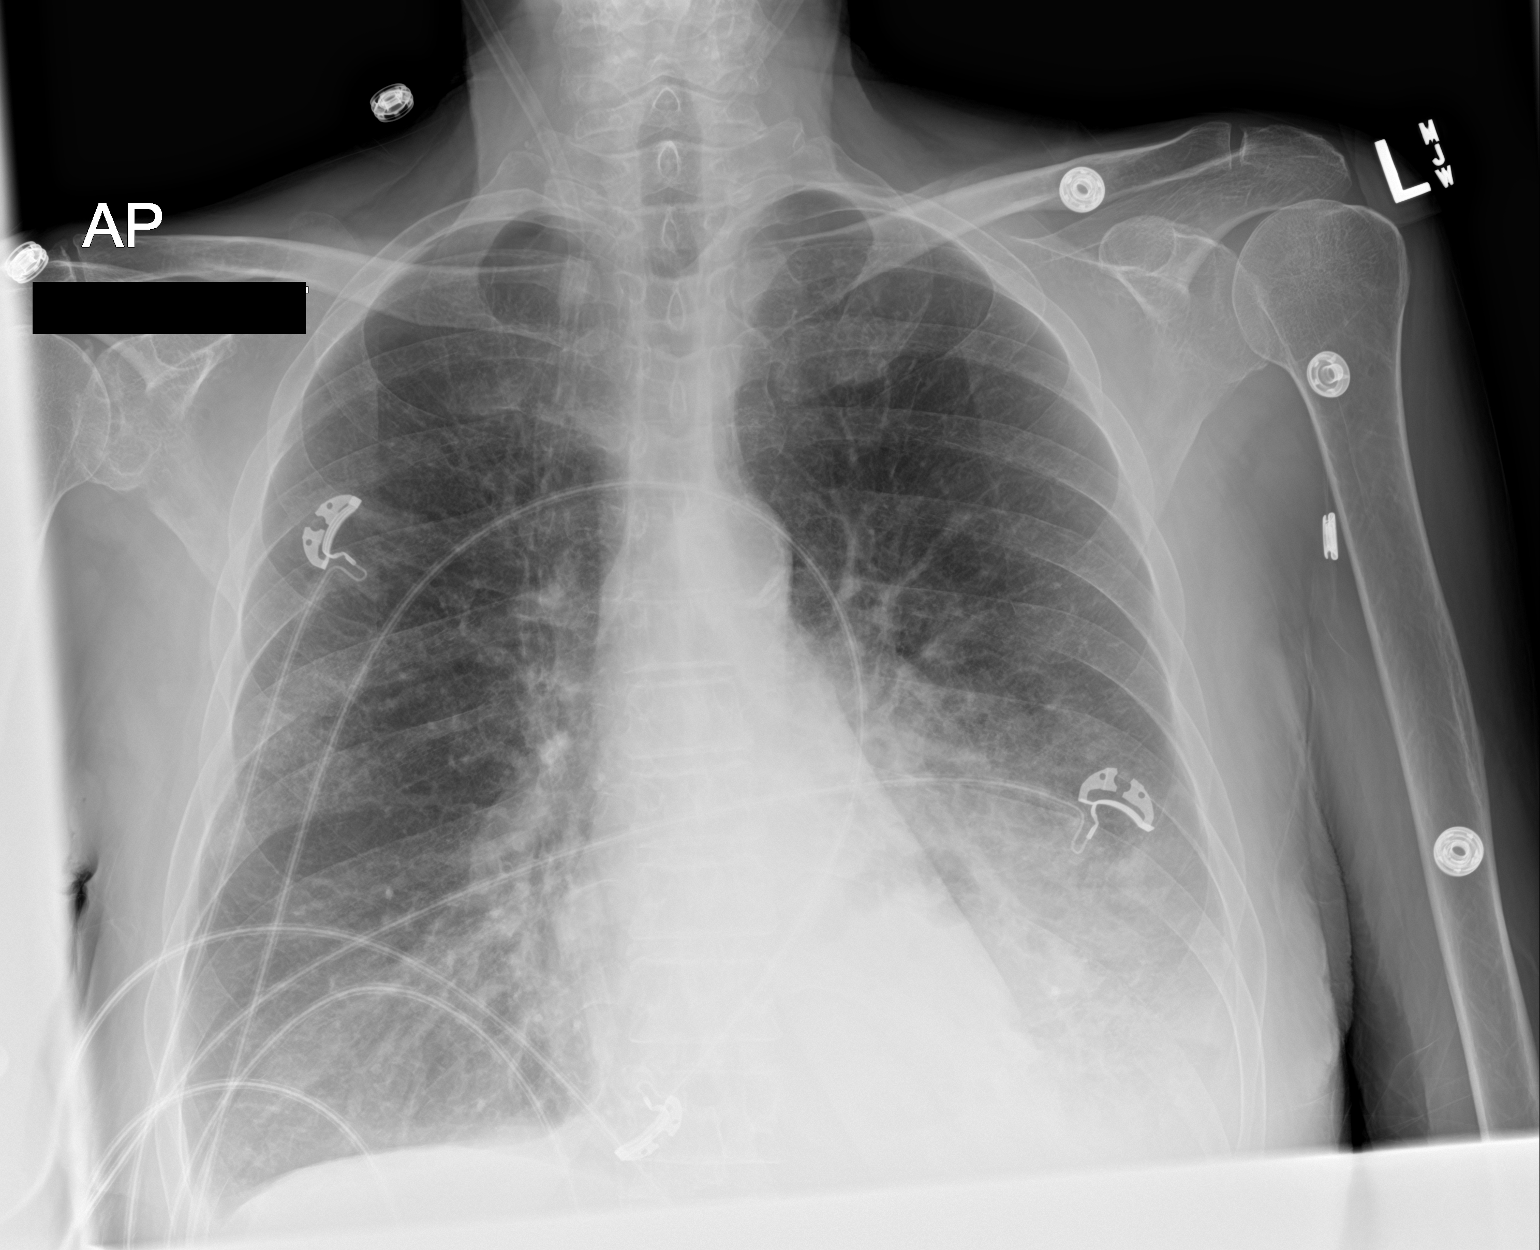

[2 of 2 positions shown; findings below may reference images not displayed]

FINDINGS: Cardiac shadow is stable. The lungs are again hyperinflated.
Significant left lower lobe infiltrate is again seen with small
effusion. The degree of infiltrate has increased in the interval
from the prior exam. Additionally the degree of cavitation is
increased from the prior exam. Additionally there is some increased
infiltrate in the lingula on the left.
IMPRESSION: Cavitary infiltrate in the left lower lobe which is increased in the
interval from the prior exam. Additionally there is some new
lingular involvement noted.

## 2016-11-14 DIAGNOSIS — R638 Other symptoms and signs concerning food and fluid intake: Secondary | ICD-10-CM | POA: Diagnosis not present

## 2016-11-14 DIAGNOSIS — A0472 Enterocolitis due to Clostridium difficile, not specified as recurrent: Secondary | ICD-10-CM | POA: Diagnosis not present

## 2016-11-14 DIAGNOSIS — R109 Unspecified abdominal pain: Secondary | ICD-10-CM | POA: Diagnosis not present

## 2016-11-14 DIAGNOSIS — K2970 Gastritis, unspecified, without bleeding: Secondary | ICD-10-CM | POA: Diagnosis not present

## 2016-11-15 DIAGNOSIS — R638 Other symptoms and signs concerning food and fluid intake: Secondary | ICD-10-CM | POA: Diagnosis not present

## 2016-11-15 DIAGNOSIS — R109 Unspecified abdominal pain: Secondary | ICD-10-CM | POA: Diagnosis not present

## 2016-11-15 DIAGNOSIS — A0472 Enterocolitis due to Clostridium difficile, not specified as recurrent: Secondary | ICD-10-CM | POA: Diagnosis not present

## 2016-11-15 DIAGNOSIS — K2970 Gastritis, unspecified, without bleeding: Secondary | ICD-10-CM | POA: Diagnosis not present

## 2016-11-16 DIAGNOSIS — A0472 Enterocolitis due to Clostridium difficile, not specified as recurrent: Secondary | ICD-10-CM | POA: Diagnosis not present

## 2016-11-16 DIAGNOSIS — K2970 Gastritis, unspecified, without bleeding: Secondary | ICD-10-CM | POA: Diagnosis not present

## 2016-11-16 DIAGNOSIS — R109 Unspecified abdominal pain: Secondary | ICD-10-CM | POA: Diagnosis not present

## 2016-11-16 DIAGNOSIS — R638 Other symptoms and signs concerning food and fluid intake: Secondary | ICD-10-CM | POA: Diagnosis not present

## 2016-11-17 DIAGNOSIS — R638 Other symptoms and signs concerning food and fluid intake: Secondary | ICD-10-CM | POA: Diagnosis not present

## 2016-11-17 DIAGNOSIS — K2970 Gastritis, unspecified, without bleeding: Secondary | ICD-10-CM | POA: Diagnosis not present

## 2016-11-17 DIAGNOSIS — R109 Unspecified abdominal pain: Secondary | ICD-10-CM | POA: Diagnosis not present

## 2016-11-17 DIAGNOSIS — A0472 Enterocolitis due to Clostridium difficile, not specified as recurrent: Secondary | ICD-10-CM | POA: Diagnosis not present

## 2016-12-02 ENCOUNTER — Telehealth (HOSPITAL_COMMUNITY): Payer: Self-pay

## 2016-12-02 NOTE — Telephone Encounter (Signed)
Called to schedule f/u mri, left message for pt to return vm. AW

## 2016-12-11 DIAGNOSIS — C3490 Malignant neoplasm of unspecified part of unspecified bronchus or lung: Secondary | ICD-10-CM | POA: Diagnosis not present

## 2016-12-11 DIAGNOSIS — C7931 Secondary malignant neoplasm of brain: Secondary | ICD-10-CM

## 2017-01-04 DIAGNOSIS — R05 Cough: Secondary | ICD-10-CM

## 2017-01-04 DIAGNOSIS — R197 Diarrhea, unspecified: Secondary | ICD-10-CM | POA: Diagnosis not present

## 2017-01-04 DIAGNOSIS — R630 Anorexia: Secondary | ICD-10-CM

## 2017-01-04 DIAGNOSIS — C3432 Malignant neoplasm of lower lobe, left bronchus or lung: Secondary | ICD-10-CM | POA: Diagnosis not present

## 2017-01-08 DIAGNOSIS — C3490 Malignant neoplasm of unspecified part of unspecified bronchus or lung: Secondary | ICD-10-CM | POA: Diagnosis not present

## 2017-01-08 DIAGNOSIS — C7931 Secondary malignant neoplasm of brain: Secondary | ICD-10-CM | POA: Diagnosis not present

## 2017-01-21 ENCOUNTER — Telehealth (HOSPITAL_COMMUNITY): Payer: Self-pay

## 2017-01-21 NOTE — Telephone Encounter (Signed)
Called to schedule f/u MRI, left message for pt to return call. AW

## 2017-01-29 DIAGNOSIS — R197 Diarrhea, unspecified: Secondary | ICD-10-CM

## 2017-01-29 DIAGNOSIS — C7931 Secondary malignant neoplasm of brain: Secondary | ICD-10-CM

## 2017-01-29 DIAGNOSIS — C3432 Malignant neoplasm of lower lobe, left bronchus or lung: Secondary | ICD-10-CM | POA: Diagnosis not present

## 2017-02-19 DIAGNOSIS — C3490 Malignant neoplasm of unspecified part of unspecified bronchus or lung: Secondary | ICD-10-CM | POA: Diagnosis not present

## 2017-02-19 DIAGNOSIS — R197 Diarrhea, unspecified: Secondary | ICD-10-CM

## 2017-02-19 DIAGNOSIS — C7931 Secondary malignant neoplasm of brain: Secondary | ICD-10-CM | POA: Diagnosis not present

## 2017-03-19 DIAGNOSIS — C7931 Secondary malignant neoplasm of brain: Secondary | ICD-10-CM | POA: Diagnosis not present

## 2017-03-19 DIAGNOSIS — C3490 Malignant neoplasm of unspecified part of unspecified bronchus or lung: Secondary | ICD-10-CM | POA: Diagnosis not present

## 2017-05-07 DIAGNOSIS — C3490 Malignant neoplasm of unspecified part of unspecified bronchus or lung: Secondary | ICD-10-CM

## 2017-05-07 DIAGNOSIS — C7931 Secondary malignant neoplasm of brain: Secondary | ICD-10-CM

## 2017-05-28 DIAGNOSIS — C7931 Secondary malignant neoplasm of brain: Secondary | ICD-10-CM | POA: Diagnosis not present

## 2017-05-28 DIAGNOSIS — C3432 Malignant neoplasm of lower lobe, left bronchus or lung: Secondary | ICD-10-CM

## 2017-06-04 DIAGNOSIS — C3432 Malignant neoplasm of lower lobe, left bronchus or lung: Secondary | ICD-10-CM | POA: Diagnosis not present

## 2017-07-08 DIAGNOSIS — E876 Hypokalemia: Secondary | ICD-10-CM | POA: Diagnosis not present

## 2017-07-08 DIAGNOSIS — E860 Dehydration: Secondary | ICD-10-CM | POA: Diagnosis not present

## 2017-07-08 DIAGNOSIS — A0472 Enterocolitis due to Clostridium difficile, not specified as recurrent: Secondary | ICD-10-CM | POA: Diagnosis not present

## 2017-07-08 DIAGNOSIS — C3432 Malignant neoplasm of lower lobe, left bronchus or lung: Secondary | ICD-10-CM | POA: Diagnosis not present

## 2017-07-30 DIAGNOSIS — Z923 Personal history of irradiation: Secondary | ICD-10-CM | POA: Diagnosis not present

## 2017-07-30 DIAGNOSIS — C3432 Malignant neoplasm of lower lobe, left bronchus or lung: Secondary | ICD-10-CM | POA: Diagnosis not present

## 2017-08-20 DIAGNOSIS — C3432 Malignant neoplasm of lower lobe, left bronchus or lung: Secondary | ICD-10-CM | POA: Diagnosis not present

## 2017-10-01 DIAGNOSIS — R197 Diarrhea, unspecified: Secondary | ICD-10-CM | POA: Diagnosis not present

## 2017-10-01 DIAGNOSIS — C3432 Malignant neoplasm of lower lobe, left bronchus or lung: Secondary | ICD-10-CM | POA: Diagnosis not present

## 2017-10-01 DIAGNOSIS — E860 Dehydration: Secondary | ICD-10-CM | POA: Diagnosis not present

## 2017-12-03 DIAGNOSIS — C3432 Malignant neoplasm of lower lobe, left bronchus or lung: Secondary | ICD-10-CM | POA: Diagnosis not present

## 2018-03-03 DIAGNOSIS — R197 Diarrhea, unspecified: Secondary | ICD-10-CM | POA: Diagnosis not present

## 2018-03-03 DIAGNOSIS — C3432 Malignant neoplasm of lower lobe, left bronchus or lung: Secondary | ICD-10-CM

## 2018-03-03 DIAGNOSIS — R110 Nausea: Secondary | ICD-10-CM | POA: Diagnosis not present

## 2018-05-19 NOTE — Telephone Encounter (Signed)
Phone call attempt 

## 2018-07-05 DIAGNOSIS — R2681 Unsteadiness on feet: Secondary | ICD-10-CM

## 2018-07-05 DIAGNOSIS — Z923 Personal history of irradiation: Secondary | ICD-10-CM | POA: Diagnosis not present

## 2018-07-05 DIAGNOSIS — Z9221 Personal history of antineoplastic chemotherapy: Secondary | ICD-10-CM | POA: Diagnosis not present

## 2018-07-05 DIAGNOSIS — R51 Headache: Secondary | ICD-10-CM

## 2018-07-05 DIAGNOSIS — C7801 Secondary malignant neoplasm of right lung: Secondary | ICD-10-CM | POA: Diagnosis not present

## 2018-07-05 DIAGNOSIS — C3432 Malignant neoplasm of lower lobe, left bronchus or lung: Secondary | ICD-10-CM

## 2018-07-05 DIAGNOSIS — I2699 Other pulmonary embolism without acute cor pulmonale: Secondary | ICD-10-CM

## 2018-07-06 DIAGNOSIS — Z9221 Personal history of antineoplastic chemotherapy: Secondary | ICD-10-CM

## 2018-07-06 DIAGNOSIS — C3432 Malignant neoplasm of lower lobe, left bronchus or lung: Secondary | ICD-10-CM

## 2018-07-06 DIAGNOSIS — Z923 Personal history of irradiation: Secondary | ICD-10-CM | POA: Diagnosis not present

## 2018-07-06 DIAGNOSIS — C7801 Secondary malignant neoplasm of right lung: Secondary | ICD-10-CM | POA: Diagnosis not present

## 2018-10-05 DIAGNOSIS — C7801 Secondary malignant neoplasm of right lung: Secondary | ICD-10-CM

## 2018-10-05 DIAGNOSIS — Z923 Personal history of irradiation: Secondary | ICD-10-CM

## 2018-10-05 DIAGNOSIS — R042 Hemoptysis: Secondary | ICD-10-CM

## 2018-10-05 DIAGNOSIS — Z9221 Personal history of antineoplastic chemotherapy: Secondary | ICD-10-CM | POA: Diagnosis not present

## 2018-10-05 DIAGNOSIS — C3432 Malignant neoplasm of lower lobe, left bronchus or lung: Secondary | ICD-10-CM

## 2018-12-06 DIAGNOSIS — C3432 Malignant neoplasm of lower lobe, left bronchus or lung: Secondary | ICD-10-CM | POA: Diagnosis not present

## 2018-12-06 DIAGNOSIS — Z923 Personal history of irradiation: Secondary | ICD-10-CM

## 2018-12-06 DIAGNOSIS — C7801 Secondary malignant neoplasm of right lung: Secondary | ICD-10-CM

## 2018-12-06 DIAGNOSIS — Z9221 Personal history of antineoplastic chemotherapy: Secondary | ICD-10-CM

## 2018-12-06 DIAGNOSIS — R05 Cough: Secondary | ICD-10-CM

## 2019-02-03 DIAGNOSIS — C7801 Secondary malignant neoplasm of right lung: Secondary | ICD-10-CM

## 2019-02-03 DIAGNOSIS — C3432 Malignant neoplasm of lower lobe, left bronchus or lung: Secondary | ICD-10-CM | POA: Diagnosis not present

## 2019-03-07 DIAGNOSIS — C7801 Secondary malignant neoplasm of right lung: Secondary | ICD-10-CM | POA: Diagnosis not present

## 2019-03-07 DIAGNOSIS — C3432 Malignant neoplasm of lower lobe, left bronchus or lung: Secondary | ICD-10-CM

## 2019-03-09 DIAGNOSIS — C3432 Malignant neoplasm of lower lobe, left bronchus or lung: Secondary | ICD-10-CM

## 2019-03-09 DIAGNOSIS — C7801 Secondary malignant neoplasm of right lung: Secondary | ICD-10-CM

## 2019-03-28 DIAGNOSIS — C3432 Malignant neoplasm of lower lobe, left bronchus or lung: Secondary | ICD-10-CM

## 2019-03-28 DIAGNOSIS — C7801 Secondary malignant neoplasm of right lung: Secondary | ICD-10-CM

## 2019-04-20 DIAGNOSIS — C3432 Malignant neoplasm of lower lobe, left bronchus or lung: Secondary | ICD-10-CM

## 2019-04-20 DIAGNOSIS — C7931 Secondary malignant neoplasm of brain: Secondary | ICD-10-CM

## 2019-05-11 DIAGNOSIS — C3432 Malignant neoplasm of lower lobe, left bronchus or lung: Secondary | ICD-10-CM

## 2019-05-30 DIAGNOSIS — C3432 Malignant neoplasm of lower lobe, left bronchus or lung: Secondary | ICD-10-CM | POA: Diagnosis not present

## 2019-08-10 DEATH — deceased

## 2021-08-09 DEATH — deceased
